# Patient Record
Sex: Female | Born: 1985
Health system: Southern US, Community
[De-identification: ages and names within clinical notes are randomized; demographics above are authoritative.]

## PROBLEM LIST (undated history)

## (undated) DIAGNOSIS — R569 Unspecified convulsions: Secondary | ICD-10-CM

## (undated) DIAGNOSIS — F1010 Alcohol abuse, uncomplicated: Secondary | ICD-10-CM

---

## 2015-09-24 ENCOUNTER — Ambulatory Visit (INDEPENDENT_AMBULATORY_CARE_PROVIDER_SITE_OTHER): Payer: Self-pay | Admitting: Physician Assistant

## 2015-09-24 ENCOUNTER — Ambulatory Visit (INDEPENDENT_AMBULATORY_CARE_PROVIDER_SITE_OTHER): Payer: Self-pay

## 2015-09-24 VITALS — BP 122/80 | HR 99 | Temp 98.9°F | Resp 17 | Ht 66.0 in | Wt 130.0 lb

## 2015-09-24 DIAGNOSIS — M7022 Olecranon bursitis, left elbow: Secondary | ICD-10-CM

## 2015-09-24 DIAGNOSIS — L03114 Cellulitis of left upper limb: Secondary | ICD-10-CM

## 2015-09-24 LAB — POCT CBC
GRANULOCYTE PERCENT: 79.6 % (ref 37–80)
HEMATOCRIT: 43.1 % (ref 37.7–47.9)
Hemoglobin: 14.8 g/dL (ref 12.2–16.2)
Lymph, poc: 1.6 (ref 0.6–3.4)
MCH: 32.1 pg — AB (ref 27–31.2)
MCHC: 34.3 g/dL (ref 31.8–35.4)
MCV: 93.5 fL (ref 80–97)
MID (CBC): 0.6 (ref 0–0.9)
MPV: 7.1 fL (ref 0–99.8)
POC GRANULOCYTE: 8.8 — AB (ref 2–6.9)
POC LYMPH %: 14.5 % (ref 10–50)
POC MID %: 5.9 %M (ref 0–12)
Platelet Count, POC: 254 10*3/uL (ref 142–424)
RBC: 4.61 M/uL (ref 4.04–5.48)
RDW, POC: 12.7 %
WBC: 11 10*3/uL — AB (ref 4.6–10.2)

## 2015-09-24 MED ORDER — CEPHALEXIN 500 MG PO CAPS: 500.0000 mg | ORAL_CAPSULE | Freq: Four times a day (QID) | ORAL | Status: DC

## 2015-09-24 NOTE — Patient Instructions (Signed)
     IF you received an x-ray today, you will receive an invoice from [facility]. Please contact [facility] at [phone] with questions or concerns regarding your invoice.   IF you received labwork today, you will receive an invoice from [facility]. Please contact [facility] at [phone] with questions or concerns regarding your invoice.   Our billing staff will not be able to assist you with questions regarding bills from these companies.  You will be contacted with the lab results as soon as they are available. The fastest way to get your results is to activate your My Chart account. Instructions are located on the last page of this paperwork. If you have not heard from us regarding the results in 2 weeks, please contact this office.      

## 2015-09-24 NOTE — Progress Notes (Signed)
09/24/2015 1:25 PM   DOB: 1986-01-13 / MRN: [id number]  SUBJECTIVE:  Angela Palmer is a 30 y.o. female presenting for a swollen left elbow bursa.  The area has been mildly swollen for about 1 week, however it has become exquisitely tender and red in the last 24 hours.  She denies chills, nausea, emesis, fever at this time.  She has not tried any medication at this time.   She is eating well.   She has No Known Allergies.   She  has no past medical history on file.    She  reports that she has never smoked. She does not have any smokeless tobacco history on file. She  has no sexual activity history on file. The patient  has no past surgical history on file.  Her family history is not on file.  Review of Systems  Constitutional: Negative for fever and chills.  Respiratory: Negative for cough.   Cardiovascular: Negative for chest pain.  Gastrointestinal: Negative for abdominal pain.  Musculoskeletal: Positive for joint pain. Negative for myalgias and falls.  Skin: Negative for itching and rash.  Neurological: Negative for dizziness and headaches.    Problem list and medications reviewed and updated by myself where necessary, and exist elsewhere in the encounter.   OBJECTIVE:  BP 122/80 mmHg  Pulse 99  Temp(Src) 98.9 F (37.2 C) (Oral)  Resp 17  Ht  (1.676 m)  Wt 130 lb (58.968 kg)  BMI 20.99 kg/m2  SpO2 99%  LMP 09/10/2015  Physical Exam  Constitutional: She is oriented to person, place, and time. She appears well-nourished. No distress.  Eyes: EOM are normal. Pupils are equal, round, and reactive to light.  Cardiovascular: Normal rate.   Pulmonary/Chest: Effort normal and breath sounds normal.  Abdominal: She exhibits no distension.  Musculoskeletal:       Arms: Neurological: She is alert and oriented to person, place, and time. No cranial nerve deficit. Gait normal.  Skin: Skin is dry. She is not diaphoretic.  Psychiatric: She has a normal mood and affect.    Vitals reviewed.   Results for orders placed or performed in visit on 09/24/15 (from the past 72 hour(s))  POCT CBC     Status: Abnormal   Collection Time: 09/24/15  1:05 PM  Result Value Ref Range   WBC 11.0 (A) 4.6 - 10.2 K/uL   Lymph, poc 1.6 0.6 - 3.4   POC LYMPH PERCENT 14.5 10 - 50 %L   MID (cbc) 0.6 0 - 0.9   POC MID % 5.9 0 - 12 %M   POC Granulocyte 8.8 (A) 2 - 6.9   Granulocyte percent 79.6 37 - 80 %G   RBC 4.61 4.04 - 5.48 M/uL   Hemoglobin 14.8 12.2 - 16.2 g/dL   HCT, POC 96.2 95.2 - 47.9 %   MCV 93.5 80 - 97 fL   MCH, POC 32.1 (A) 27 - 31.2 pg   MCHC 34.3 31.8 - 35.4 g/dL   RDW, POC 84.1 %   Platelet Count, POC 254 142 - 424 K/uL   MPV 7.1 0 - 99.8 fL    Dg Elbow Complete Left  09/24/2015  CLINICAL DATA:  Swelling, erythema, bursitis EXAM: LEFT ELBOW - COMPLETE 3+ VIEW COMPARISON:  None. FINDINGS: Four views of the left elbow submitted. No acute fracture or subluxation. No periosteal reaction or bony erosion. There is significant soft tissue swelling dorsal elbow region highly suspicious for olecranon bursitis or cellulitis. No joint  effusion. IMPRESSION: No acute fracture or subluxation. Soft tissue swelling dorsal elbow region highly suspicious for olecranon bursitis or cellulitis. Electronically Signed   By: Natasha MeadLiviu  Pop M.D.   On: 09/24/2015 13:09    ASSESSMENT AND PLAN  Angela Semenshton was seen today for elbow injury.  Diagnoses and all orders for this visit:  Bursitis of elbow, left:  -     DG Elbow Complete Left; Future -     POCT CBC  Cellulitis of left upper extremity: Will start keflex today.  If no improvement in the next 24 hours she will return to the office.  If improving will see her back in 48 hours.  Arm padded with casting cotton and an ace.    The patient was advised to call or return to clinic if she does not see an improvement in symptoms or to seek the care of the closest emergency department if she worsens with the above plan.   Deliah BostonMichael Jaki Hammerschmidt, MHS,  PA-C [facility] 09/24/2015 1:25 PM

## 2016-05-07 ENCOUNTER — Observation Stay (HOSPITAL_COMMUNITY)
Admission: EM | Admit: 2016-05-07 | Discharge: 2016-05-09 | Disposition: A | Discharge status: Home / Self Care | Payer: 59 | Attending: Internal Medicine | Admitting: Internal Medicine

## 2016-05-07 ENCOUNTER — Encounter (HOSPITAL_COMMUNITY): Payer: Self-pay

## 2016-05-07 ENCOUNTER — Inpatient Hospital Stay (HOSPITAL_COMMUNITY): Payer: 59

## 2016-05-07 ENCOUNTER — Emergency Department (HOSPITAL_COMMUNITY): Payer: 59

## 2016-05-07 DIAGNOSIS — F10239 Alcohol dependence with withdrawal, unspecified: Principal | ICD-10-CM | POA: Insufficient documentation

## 2016-05-07 DIAGNOSIS — R7401 Elevation of levels of liver transaminase levels: Secondary | ICD-10-CM | POA: Diagnosis present

## 2016-05-07 DIAGNOSIS — F1010 Alcohol abuse, uncomplicated: Secondary | ICD-10-CM

## 2016-05-07 DIAGNOSIS — R569 Unspecified convulsions: Secondary | ICD-10-CM | POA: Diagnosis not present

## 2016-05-07 DIAGNOSIS — E876 Hypokalemia: Secondary | ICD-10-CM | POA: Diagnosis not present

## 2016-05-07 DIAGNOSIS — R740 Nonspecific elevation of levels of transaminase and lactic acid dehydrogenase [LDH]: Secondary | ICD-10-CM | POA: Insufficient documentation

## 2016-05-07 DIAGNOSIS — E872 Acidosis, unspecified: Secondary | ICD-10-CM | POA: Diagnosis present

## 2016-05-07 LAB — COMPREHENSIVE METABOLIC PANEL
ALK PHOS: 59 U/L (ref 38–126)
ALT: 98 U/L — ABNORMAL HIGH (ref 14–54)
AST: 153 U/L — AB (ref 15–41)
Albumin: 5.6 g/dL — ABNORMAL HIGH (ref 3.5–5.0)
Anion gap: 19 — ABNORMAL HIGH (ref 5–15)
BILIRUBIN TOTAL: 0.9 mg/dL (ref 0.3–1.2)
BUN: 6 mg/dL (ref 6–20)
CALCIUM: 9.7 mg/dL (ref 8.9–10.3)
CO2: 18 mmol/L — ABNORMAL LOW (ref 22–32)
CREATININE: 0.57 mg/dL (ref 0.44–1.00)
Chloride: 98 mmol/L — ABNORMAL LOW (ref 101–111)
GFR calc Af Amer: >60 mL/min (ref >60)
GLUCOSE: 152 mg/dL — AB (ref 65–99)
POTASSIUM: 3.4 mmol/L — AB (ref 3.5–5.1)
Sodium: 135 mmol/L (ref 135–145)
TOTAL PROTEIN: 8.3 g/dL — AB (ref 6.5–8.1)

## 2016-05-07 LAB — CBC WITH DIFFERENTIAL/PLATELET
BASOS ABS: 0.1 10*3/uL (ref 0.0–0.1)
Basophils Relative: 1 %
Eosinophils Absolute: 0 10*3/uL (ref 0.0–0.7)
Eosinophils Relative: 0 %
HEMATOCRIT: 40.6 % (ref 36.0–46.0)
HEMOGLOBIN: 13.9 g/dL (ref 12.0–15.0)
LYMPHS PCT: 28 %
Lymphs Abs: 1.4 10*3/uL (ref 0.7–4.0)
MCH: 32.6 pg (ref 26.0–34.0)
MCHC: 34.2 g/dL (ref 30.0–36.0)
MCV: 95.3 fL (ref 78.0–100.0)
MONO ABS: 0.6 10*3/uL (ref 0.1–1.0)
Monocytes Relative: 12 %
NEUTROS ABS: 2.9 10*3/uL (ref 1.7–7.7)
NEUTROS PCT: 59 %
Platelets: 169 10*3/uL (ref 150–400)
RBC: 4.26 MIL/uL (ref 3.87–5.11)
RDW: 13.1 % (ref 11.5–15.5)
WBC: 5 10*3/uL (ref 4.0–10.5)

## 2016-05-07 LAB — RAPID URINE DRUG SCREEN, HOSP PERFORMED
Amphetamines: NOT DETECTED
Barbiturates: NOT DETECTED
Benzodiazepines: NOT DETECTED
COCAINE: NOT DETECTED
OPIATES: NOT DETECTED
TETRAHYDROCANNABINOL: NOT DETECTED

## 2016-05-07 LAB — GAMMA GT: GGT: 128 U/L — AB (ref 7–50)

## 2016-05-07 LAB — URINALYSIS, ROUTINE W REFLEX MICROSCOPIC
BACTERIA UA: NONE SEEN
BILIRUBIN URINE: NEGATIVE
Glucose, UA: NEGATIVE mg/dL
KETONES UR: 20 mg/dL — AB
LEUKOCYTES UA: NEGATIVE
Nitrite: NEGATIVE
PROTEIN: 30 mg/dL — AB
Specific Gravity, Urine: 1.011 (ref 1.005–1.030)
pH: 6 (ref 5.0–8.0)

## 2016-05-07 LAB — I-STAT BETA HCG BLOOD, ED (MC, WL, AP ONLY): I-stat hCG, quantitative: <5 m[IU]/mL (ref <5)

## 2016-05-07 LAB — CK: Total CK: 182 U/L (ref 38–234)

## 2016-05-07 LAB — ETHANOL

## 2016-05-07 LAB — MAGNESIUM: MAGNESIUM: 1.8 mg/dL (ref 1.7–2.4)

## 2016-05-07 MED ORDER — LORAZEPAM 1 MG PO TABS: 1.0000 mg | ORAL_TABLET | Freq: Four times a day (QID) | ORAL | Status: DC | PRN

## 2016-05-07 MED ORDER — FOLIC ACID 5 MG/ML IJ SOLN
1.0000 mg | Freq: Every day | INTRAMUSCULAR | Status: DC
  Administered 2016-05-07: 1 mg via INTRAVENOUS
  Filled 2016-05-07 (×2): qty 0.2

## 2016-05-07 MED ORDER — SODIUM CHLORIDE 0.9 % IV BOLUS (SEPSIS)
1000.0000 mL | Freq: Once | INTRAVENOUS | Status: AC
  Administered 2016-05-07: 1000 mL via INTRAVENOUS

## 2016-05-07 MED ORDER — ADULT MULTIVITAMIN W/MINERALS CH
1.0000 | ORAL_TABLET | Freq: Every day | ORAL | Status: DC
  Administered 2016-05-08 – 2016-05-09 (×3): 1 via ORAL
  Filled 2016-05-07 (×3): qty 1

## 2016-05-07 MED ORDER — FOLIC ACID 1 MG PO TABS
1.0000 mg | ORAL_TABLET | Freq: Every day | ORAL | Status: DC
  Administered 2016-05-08 – 2016-05-09 (×2): 1 mg via ORAL
  Filled 2016-05-07 (×2): qty 1

## 2016-05-07 MED ORDER — THIAMINE HCL 100 MG/ML IJ SOLN
100.0000 mg | Freq: Once | INTRAMUSCULAR | Status: AC
  Administered 2016-05-07: 100 mg via INTRAVENOUS
  Filled 2016-05-07: qty 2

## 2016-05-07 MED ORDER — SODIUM CHLORIDE 0.9 % IV SOLN
75.0000 mL/h | INTRAVENOUS | Status: DC
  Administered 2016-05-07 – 2016-05-08 (×2): 75 mL/h via INTRAVENOUS

## 2016-05-07 MED ORDER — ENOXAPARIN SODIUM 40 MG/0.4ML ~~LOC~~ SOLN
40.0000 mg | Freq: Every day | SUBCUTANEOUS | Status: DC
  Administered 2016-05-08: 40 mg via SUBCUTANEOUS
  Filled 2016-05-07 (×2): qty 0.4

## 2016-05-07 MED ORDER — VITAMIN B-1 100 MG PO TABS
100.0000 mg | ORAL_TABLET | Freq: Every day | ORAL | Status: DC
  Administered 2016-05-08 – 2016-05-09 (×2): 100 mg via ORAL
  Filled 2016-05-07 (×2): qty 1

## 2016-05-07 MED ORDER — ONDANSETRON HCL 4 MG PO TABS: 4.0000 mg | ORAL_TABLET | Freq: Four times a day (QID) | ORAL | Status: DC | PRN

## 2016-05-07 MED ORDER — THIAMINE HCL 100 MG/ML IJ SOLN: 100.0000 mg | Freq: Every day | INTRAMUSCULAR | Status: DC

## 2016-05-07 MED ORDER — LORAZEPAM 2 MG/ML IJ SOLN
1.0000 mg | Freq: Once | INTRAMUSCULAR | Status: AC
  Administered 2016-05-07: 1 mg via INTRAVENOUS
  Filled 2016-05-07: qty 1

## 2016-05-07 MED ORDER — POTASSIUM CHLORIDE CRYS ER 20 MEQ PO TBCR
30.0000 meq | EXTENDED_RELEASE_TABLET | ORAL | Status: AC
  Administered 2016-05-07: 30 meq via ORAL
  Filled 2016-05-07: qty 1

## 2016-05-07 MED ORDER — LORAZEPAM 2 MG/ML IJ SOLN: 1.0000 mg | Freq: Four times a day (QID) | INTRAMUSCULAR | Status: DC | PRN

## 2016-05-07 MED ORDER — LORAZEPAM 2 MG/ML IJ SOLN: 1.0000 mg | INTRAMUSCULAR | Status: DC | PRN

## 2016-05-07 MED ORDER — ONDANSETRON HCL 4 MG/2ML IJ SOLN: 4.0000 mg | Freq: Four times a day (QID) | INTRAMUSCULAR | Status: DC | PRN

## 2016-05-07 NOTE — ED Triage Notes (Signed)
Pt presents after seizure-like activity.  Denies pain.  Pt's boyfriend reports Pt "stiffened out for around 2-3 minutes."  Sts similar episode around 1 month ago.  Denies aura and seizure history.

## 2016-05-07 NOTE — ED Notes (Signed)
Attempted to give report, RN unavailable, will return call

## 2016-05-07 NOTE — ED Notes (Signed)
Pt appears calm and without distress, SZ pad in place. Pt denies pain at this tim.

## 2016-05-07 NOTE — ED Notes (Signed)
[facility],

## 2016-05-07 NOTE — H&P (Addendum)
History and Physical    Angela Lindenshton Dawn Narciso ZOX:[id number] DOB: 1985-08-29 DOA: 05/07/2016  Referring MD/NP/PA: Dr. Alyce PaganIsaccs PCP: No primary care provider on file.  Patient coming from: Home  Chief Complaint:  Seizure  HPI: Angela Palmer is a 31 y.o. female with medical history significant of alcohol abuse; who presents after having what is thought to be a seizure. History obtained from the patient's boyfriend has patient was unaware of episode. Patient had just sat down in the passenger seat of the car with her boyfriend this afternoon when the event happen. He noted stiffening of her upper and lower extremities, was nonresponsive, and then seemed as though she was stretching out her arms like she were yawning. Associated symptoms included drooling, gargling, tongue biting, and intermittent jerking like movements of the upper and lower extremities. He estimates that symptoms lasted 2-3 minutes. Thereafter, the next 10-20 minutes the patient was confused with a glossed over stare not following commands. She did not lose bowel or bladder. Similar episode occurred approximately one month ago while on the couch where she bite her tongue was bleeding.  On average patient drinks on a daily basis. On an average day of drinking the patient gives the example of consuming 3 or more shots and a High-life 12 ounce beer. Her last drink 12 ounce beer earlier this morning. Denies any alcohol, illicit drug use, chest pain, nausea, vomiting, diarrhea, focal weakness, change of vision, or rash.    ED Course: Upon admission into the emergency department patient was noted be afebrile, pulse 118, respirations 23, and O2 saturations/ blood pressure maintained. Initial CT scan of the brain show no acute abnormalities. Lab work revealed normal CBC, potassium 3.4, chloride 98, CO2 18, BUN 6, creatinine 0.57, calcium 9.7, glucose 152, anion gap 19, albumin 5.6, total protein 8.3, AST 153, ALT 98, and all other labs were  relatively within normal limits. Urinalysis was negative for signs of infection, but positive for ketones and Hbg. Patient was given folic acid 1 g, thiamine 100 mg, 2 mg of Ativan, and 1 L normal saline IV fluids in the ED. Neurology was consulted and recommended [facility] bed for further testing including MRI and EEG.  Review of Systems: As per HPI otherwise 10 point review of systems negative.   History reviewed. No pertinent past medical history.  History reviewed. No pertinent surgical history.   reports that she has never smoked. She has never used smokeless tobacco. She reports that she drinks alcohol. She reports that she does not use drugs.  No Known Allergies  History reviewed. No pertinent family history.  Prior to Admission medications   Medication Sig Start Date End Date Taking? Authorizing Provider  ibuprofen (ADVIL,MOTRIN) 200 MG tablet Take 200 mg by mouth every 6 (six) hours as needed for moderate pain.   Yes Historical Provider, MD  cephALEXin (KEFLEX) 500 MG capsule Take 1 capsule (500 mg total) by mouth 4 (four) times daily. Patient not taking: Reported on 05/07/2016 09/24/15   Ofilia NeasMichael L Clark, PA-C    Physical Exam:   Constitutional: Young female in NAD, calm, comfortable Vitals:   05/07/16 1736 05/07/16 1907  BP: 143/96 140/66  Pulse: 118 118  Resp: 22 23  Temp: 98.3 F (36.8 C)   TempSrc: Oral   SpO2: 93% 100%   Eyes: PERRL, lids and conjunctivae normal ENMT: Mucous membranes are moist. Posterior pharynx clear of any exudate or lesions.Normal dentition.  Neck: normal, supple, no masses, no  thyromegaly Respiratory: Mildly tachypneic, clear to auscultation bilaterally, no wheezing, no crackles. No accessory muscle use.  Cardiovascular: Tachycardic, no murmurs / rubs / gallops. No extremity edema. 2+ pedal pulses. No carotid bruits.  Abdomen: no tenderness, no masses palpated. No hepatosplenomegaly. Bowel sounds positive.    Musculoskeletal: no clubbing / cyanosis. No joint deformity upper and lower extremities. Good ROM, no contractures. Normal muscle tone.  Skin: no rashes, lesions, ulcers. No induration Neurologic: CN 2-12 grossly intact. Sensation intact, DTR normal. Strength 5/5 in all 4.  Psychiatric: Normal judgment and insight. Alert and oriented x 3. Normal mood.     Labs on Admission: I have personally reviewed following labs and imaging studies  CBC:  Recent Labs Lab 05/07/16 1741  WBC 5.0  NEUTROABS 2.9  HGB 13.9  HCT 40.6  MCV 95.3  PLT 169   Basic Metabolic Panel:  Recent Labs Lab 05/07/16 1741  NA 135  K 3.4*  CL 98*  CO2 18*  GLUCOSE 152*  BUN 6  CREATININE 0.57  CALCIUM 9.7  MG 1.8   GFR: CrCl cannot be calculated (Unknown ideal weight.). Liver Function Tests:  Recent Labs Lab 05/07/16 1741  AST 153*  ALT 98*  ALKPHOS 59  BILITOT 0.9  PROT 8.3*  ALBUMIN 5.6*   No results for input(s): LIPASE, AMYLASE in the last 168 hours. No results for input(s): AMMONIA in the last 168 hours. Coagulation Profile: No results for input(s): INR, PROTIME in the last 168 hours. Cardiac Enzymes: No results for input(s): CKTOTAL, CKMB, CKMBINDEX, TROPONINI in the last 168 hours. BNP (last 3 results) No results for input(s): PROBNP in the last 8760 hours. HbA1C: No results for input(s): HGBA1C in the last 72 hours. CBG: No results for input(s): GLUCAP in the last 168 hours. Lipid Profile: No results for input(s): CHOL, HDL, LDLCALC, TRIG, CHOLHDL, LDLDIRECT in the last 72 hours. Thyroid Function Tests: No results for input(s): TSH, T4TOTAL, FREET4, T3FREE, THYROIDAB in the last 72 hours. Anemia Panel: No results for input(s): VITAMINB12, FOLATE, FERRITIN, TIBC, IRON, RETICCTPCT in the last 72 hours. Urine analysis: No results found for: COLORURINE, APPEARANCEUR, LABSPEC, PHURINE, GLUCOSEU, HGBUR, BILIRUBINUR, KETONESUR, PROTEINUR, UROBILINOGEN, NITRITE,  LEUKOCYTESUR Sepsis Labs: No results found for this or any previous visit (from the past 240 hour(s)).   Radiological Exams on Admission: Ct Head Wo Contrast  Result Date: 05/07/2016 CLINICAL DATA:  Seizure. EXAM: CT HEAD WITHOUT CONTRAST TECHNIQUE: Contiguous axial images were obtained from the base of the skull through the vertex without intravenous contrast. COMPARISON:  None. FINDINGS: Brain: No evidence of acute infarction, hemorrhage, hydrocephalus, extra-axial collection or mass lesion/mass effect. Vascular: No hyperdense vessel or unexpected calcification. Skull: Normal. Negative for fracture or focal lesion. Sinuses/Orbits: No acute finding. Other: None. IMPRESSION: No acute abnormalities. Electronically Signed   By: Gerome Sam III M.D   On: 05/07/2016 18:46    EKG: ordered. Addendum impression sinus tachycardia.  Assessment/Plan Seizure: Acute. Suspect secondary to significant alcohol consumption on admission patient's alcohol level was undetectable. Nephrology is seeing a pulmonologist the [facility]. - Admit to telemetry bed at [facility] - Seizure protocol initiated - Ativan IV prn seizure  - Normal saline at 41ml/hr - Check MRI and EEG - Asked ED physician to add on UDS, CPK, GGT  - Appreciate neuro consultative services  Alcohol abuse: Patient is a daily drinker. - [facility] protocols - social work for substance abuse  Metabolic acidosis with elevated anion gap: Also thought to be secondary to  alcohol abuse. - Follow-up repeat lab work  Hypokalemia: Acute. Initial potassium 3.4.  - Give 1 dose of potassium chloride 30 mEq  po  Transamintis : Elevated AST ALT ratio that appears 1.5 to give suspicion for alcohol abuse. -  follow-up GGT   DVT prophylaxis: Lovenox   Code Status: Full Family Communication: Discussed overall plan of care with patient and boyfriend present at bedside Disposition Plan: likely  discharge home and 1-2 days  Consults called:  Neurology Admission status:observation  Clydie Braun MD [facility] Pager [phone]  If 7PM-7AM, please contact night-coverage [email or web address] Password [facility]  05/07/2016, 7:32 PM

## 2016-05-07 NOTE — ED Provider Notes (Signed)
[facility]-EMERGENCY DEPT Provider Note   CSN: [id number] Arrival date & time: 05/07/16  1724     History   Chief Complaint Chief Complaint  Patient presents with  . Seizures    HPI Angela Palmer is a 31 y.o. female.  HPI  31 yo F with no significant PMHx here with new onset seizure. Pt was reportedly in the car with her boyfriend when she acutely "locked up," looked to the side, and began to have generalized tonic activity x 3-4 minutes. She was "out of it" afterwards and did not know her name. Over the last 20 min, she has been "coming around" and is back to baseline. Currently, she denies any pain. No HA, vision changes, weakness, or numbness. No new meds or supplements. No recent trauma. She does report a h/o regular daily EtOH use, but has not decreased this. Of note, approx 1 month ago she was found confused after vomiting, and had bit her tongue. Unsure if she had a seizure.  History reviewed. No pertinent past medical history.  Patient Active Problem List   Diagnosis Date Noted  . Seizure (HCC) 05/07/2016  . Alcohol abuse 05/07/2016  . Hypokalemia 05/07/2016  . Transaminitis 05/07/2016  . Metabolic acidosis 05/07/2016    History reviewed. No pertinent surgical history.  OB History    No data available       Home Medications    Prior to Admission medications   Medication Sig Start Date End Date Taking? Authorizing Provider  ibuprofen (ADVIL,MOTRIN) 200 MG tablet Take 200 mg by mouth every 6 (six) hours as needed for moderate pain.   Yes Historical Provider, MD  cephALEXin (KEFLEX) 500 MG capsule Take 1 capsule (500 mg total) by mouth 4 (four) times daily. Patient not taking: Reported on 05/07/2016 09/24/15   Ofilia NeasMichael L Clark, PA-C    Family History History reviewed. No pertinent family history.  Social History Social History  Substance Use Topics  . Smoking status: Never Smoker  . Smokeless tobacco: Never Used  . Alcohol use 0.0 oz/week     Allergies     Patient has no known allergies.   Review of Systems Review of Systems  Constitutional: Positive for fatigue. Negative for chills and fever.  HENT: Negative for congestion and rhinorrhea.   Eyes: Negative for visual disturbance.  Respiratory: Negative for cough, shortness of breath and wheezing.   Cardiovascular: Negative for chest pain and leg swelling.  Gastrointestinal: Negative for abdominal pain, diarrhea, nausea and vomiting.  Genitourinary: Negative for dysuria and flank pain.  Musculoskeletal: Negative for neck pain and neck stiffness.  Skin: Negative for rash and wound.  Allergic/Immunologic: Negative for immunocompromised state.  Neurological: Positive for seizures. Negative for syncope, weakness and headaches.  All other systems reviewed and are negative.    Physical Exam Updated Vital Signs BP (!) 139/96 (BP Location: Right Arm) Comment: nurse notified  Pulse 71   Temp 98.3 F (36.8 C) (Oral)   Resp 16   Ht 5\' 5"  (1.651 m)   Wt 130 lb (59 kg)   LMP 04/06/2016   SpO2 100%   BMI 21.63 kg/m   Physical Exam  Constitutional: She is oriented to person, place, and time. She appears well-developed and well-nourished. No distress.  HENT:  Head: Normocephalic and atraumatic.  Eyes: Conjunctivae are normal.  Neck: Neck supple.  Cardiovascular: Regular rhythm and normal heart sounds.  Tachycardia present.  Exam reveals no friction rub.   No murmur heard. Pulmonary/Chest: Effort normal and  breath sounds normal. No respiratory distress. She has no wheezes. She has no rales.  Abdominal: She exhibits no distension.  Musculoskeletal: She exhibits no edema.  Neurological: She is alert and oriented to person, place, and time. She exhibits normal muscle tone.  Skin: Skin is warm. Capillary refill takes less than 2 seconds.  Psychiatric: She has a normal mood and affect.  Nursing note and vitals reviewed.   Neurological Exam:  Mental Status: Alert and oriented to person,  place, and time. Attention and concentration normal. Speech clear. Recent memory is intact. Cranial Nerves: Visual fields grossly intact. EOMI and PERRLA. No nystagmus noted. Facial sensation intact at forehead, maxillary cheek, and chin/mandible bilaterally. No facial asymmetry or weakness. Hearing grossly normal. Uvula is midline, and palate elevates symmetrically. Normal SCM and trapezius strength. Tongue midline without fasciculations. Motor: Muscle strength 5/5 in proximal and distal UE and LE bilaterally. No pronator drift. Muscle tone normal. Reflexes: 2+ and symmetrical in all four extremities.  Sensation: Intact to light touch in upper and lower extremities distally bilaterally.  Gait: Normal without ataxia. Coordination: Normal FTN bilaterally.   ED Treatments / Results  Labs (all labs ordered are listed, but only abnormal results are displayed) Labs Reviewed  COMPREHENSIVE METABOLIC PANEL - Abnormal; Notable for the following:       Result Value   Potassium 3.4 (*)    Chloride 98 (*)    CO2 18 (*)    Glucose, Bld 152 (*)    Total Protein 8.3 (*)    Albumin 5.6 (*)    AST 153 (*)    ALT 98 (*)    Anion gap 19 (*)    All other components within normal limits  URINALYSIS, ROUTINE W REFLEX MICROSCOPIC - Abnormal; Notable for the following:    Color, Urine STRAW (*)    Hgb urine dipstick MODERATE (*)    Ketones, ur 20 (*)    Protein, ur 30 (*)    Squamous Epithelial / LPF 0-5 (*)    All other components within normal limits  GAMMA GT - Abnormal; Notable for the following:    GGT 128 (*)    All other components within normal limits  CBC WITH DIFFERENTIAL/PLATELET  MAGNESIUM  ETHANOL  RAPID URINE DRUG SCREEN, HOSP PERFORMED  CK  CBC  COMPREHENSIVE METABOLIC PANEL  I-STAT BETA HCG BLOOD, ED ([facility], [facility], [facility] ONLY)    EKG  EKG Interpretation None       Radiology Ct Head Wo Contrast  Result Date: 05/07/2016 CLINICAL DATA:  Seizure. EXAM: CT HEAD WITHOUT CONTRAST  TECHNIQUE: Contiguous axial images were obtained from the base of the skull through the vertex without intravenous contrast. COMPARISON:  None. FINDINGS: Brain: No evidence of acute infarction, hemorrhage, hydrocephalus, extra-axial collection or mass lesion/mass effect. Vascular: No hyperdense vessel or unexpected calcification. Skull: Normal. Negative for fracture or focal lesion. Sinuses/Orbits: No acute finding. Other: None. IMPRESSION: No acute abnormalities. Electronically Signed   By: Gerome Sam III M.D   On: 05/07/2016 18:46   Mr Brain Wo Contrast  Result Date: 05/07/2016 CLINICAL DATA:  31 y/o  F; seizure. EXAM: MRI HEAD WITHOUT CONTRAST TECHNIQUE: Multiplanar, multiecho pulse sequences of the brain and surrounding structures were obtained without intravenous contrast. COMPARISON:  05/07/2016 CT of the head. FINDINGS: Brain: No acute infarction, hemorrhage, hydrocephalus, extra-axial collection or mass lesion. Midline structures are intact including a morphologically normal pituitary as well as a complete corpus callosum and vermis. No Chiari malformation. No evidence  of cortical dysplasia, heterotopia, or disorder of cortical formation is identified. Hippocampi by are symmetric in size and signal bilaterally. Vascular: Normal flow voids. Skull and upper cervical spine: Normal marrow signal. Sinuses/Orbits: Negative. Other: None. IMPRESSION: 1. No structural cause of seizures identified. 2. Unremarkable MRI of the brain for age. Electronically Signed   By: Mitzi Hansen M.D.   On: 05/07/2016 23:06    Procedures Procedures (including critical care time)  Medications Ordered in ED Medications  folic acid injection 1 mg (1 mg Intravenous Given 05/07/16 2147)  LORazepam (ATIVAN) tablet 1 mg (not administered)    Or  LORazepam (ATIVAN) injection 1 mg (not administered)  thiamine (VITAMIN B-1) tablet 100 mg (not administered)    Or  thiamine (B-1) injection 100 mg (not  administered)  folic acid (FOLVITE) tablet 1 mg (not administered)  0.9 %  sodium chloride infusion (75 mL/hr Intravenous New Bag/Given 05/07/16 2143)  multivitamin with minerals tablet 1 tablet (1 tablet Oral Given 05/08/16 0105)  enoxaparin (LOVENOX) injection 40 mg (not administered)  LORazepam (ATIVAN) injection 1-2 mg (not administered)  ondansetron (ZOFRAN) tablet 4 mg (not administered)    Or  ondansetron (ZOFRAN) injection 4 mg (not administered)  LORazepam (ATIVAN) injection 1 mg (1 mg Intravenous Given 05/07/16 1756)  sodium chloride 0.9 % bolus 1,000 mL (0 mLs Intravenous Stopped 05/07/16 1843)  LORazepam (ATIVAN) injection 1 mg (1 mg Intravenous Given 05/07/16 1907)  thiamine (B-1) injection 100 mg (100 mg Intravenous Given 05/07/16 2146)  potassium chloride (K-DUR,KLOR-CON) CR tablet 30 mEq (30 mEq Oral Given 05/07/16 2145)     Initial Impression / Assessment and Plan / ED Course  I have reviewed the triage vital signs and the nursing notes.  Pertinent labs & imaging results that were available during my care of the patient were reviewed by me and considered in my medical decision making (see chart for details).     31 yo F with no significant PMHx here with second seizure in past month. Labs show dehydration, c/w seizure, also AST:ALT >2:1 c/w possible alcoholic hepatitis. Pt does admt to EtOH use - raising possibility of EtOH w/d seizure versus primary epilepsy. CT head neg. D/w Neuro - he recommends admit for MRI, EEG. IVF, Ativan given. Thiamine/folate given.  Final Clinical Impressions(s) / ED Diagnoses   Final diagnoses:  Seizure [facility])    New Prescriptions Current Discharge Medication List       Shaune Pollack, MD 05/08/16 [phone]

## 2016-05-08 ENCOUNTER — Inpatient Hospital Stay (HOSPITAL_COMMUNITY): Payer: 59

## 2016-05-08 DIAGNOSIS — R569 Unspecified convulsions: Secondary | ICD-10-CM | POA: Diagnosis not present

## 2016-05-08 DIAGNOSIS — F1010 Alcohol abuse, uncomplicated: Secondary | ICD-10-CM | POA: Diagnosis not present

## 2016-05-08 DIAGNOSIS — E872 Acidosis: Secondary | ICD-10-CM | POA: Diagnosis not present

## 2016-05-08 LAB — TSH: TSH: 1.456 u[IU]/mL (ref 0.350–4.500)

## 2016-05-08 NOTE — Progress Notes (Signed)
Pt arrived on unit and transferred to bed.  Boyfriend at bedside.  She is alert and oriented.  Able to make needs known and ambulate to bathroom with no difficulties. Placed on seizure precautions, telemetry, and paged neurologist of pt arrival on unit.  Awaiting response.  Will continue to monitor.

## 2016-05-08 NOTE — Progress Notes (Signed)
Pt. BP 143/108, HR 105, re checked 139/107, HR 95. Text paged Dr. York SpanielBuriev, will await for return call and continue to monitor.  Forbes Cellarelcine Reianna Batdorf, RN

## 2016-05-08 NOTE — Procedures (Signed)
ELECTROENCEPHALOGRAM REPORT  Date of Study: 05/08/2016  Patient's Name: Angela Palmer MRN: [id number] Date of Birth: 1986-02-29  Referring Provider: Dr. Madelyn Flavorsondell Smith  Clinical History: This is a 31 year old woman with seizure.  Medications: folic acid (FOLVITE) tablet 1 mg  multivitamin with minerals tablet 1 tablet  thiamine (B-1) injection 100 mg   Technical Summary: A multichannel digital EEG recording measured by the international 10-20 system with electrodes applied with paste and impedances below 5000 ohms performed in our laboratory with EKG monitoring in an awake and drowsy patient.  Hyperventilation and photic stimulation were performed.  The digital EEG was referentially recorded, reformatted, and digitally filtered in a variety of bipolar and referential montages for optimal display.    Description: The patient is awake and drowsy during the recording.  During maximal wakefulness, there is a symmetric, medium voltage 9 Hz posterior dominant rhythm that attenuates with eye opening.  The record is symmetric.  During drowsiness, there is an increase in theta slowing of the background.  Deeper stages of sleep were not seen.  Hyperventilation and photic stimulation did not elicit any abnormalities.  There were no epileptiform discharges or electrographic seizures seen.    EKG lead was unremarkable.  Impression: This awake and drowsy EEG is normal.    Clinical Correlation: A normal EEG does not exclude a clinical diagnosis of epilepsy. Clinical correlation is advised.   Patrcia DollyKaren Adalaide Jaskolski, M.D.

## 2016-05-08 NOTE — Progress Notes (Signed)
CSW received consult regarding ETOH use. CSW spoke with patient. Patient reported that she drinks "a lot" but doesn't feel like it is a problem for her. She states she drinks in social situations and she just started a new job, which has been stressful. CSW offered resources, but patient denied them.  CSW signing off.  Osborne Cascoadia Taj Arteaga LCSWA [phone]

## 2016-05-08 NOTE — Progress Notes (Signed)
[facility] PROGRESS NOTE  Angela Palmer ZOX:[id number] DOB: 1985-09-24 DOA: 05/07/2016 PCP: No primary care provider on file.  Assessment/Plan: 31 y.o. female with medical history significant of alcohol abuse presented after having what is thought to be a seizure. History obtained from the patient's boyfriend has patient was unaware of episode. Patient had just sat down in the passenger seat of the car with her boyfriend this afternoon when the event happen. He noted stiffening of her upper and lower extremities, was nonresponsive, and then seemed as though she was stretching out her arms like she were yawning  Probable alcohols withdrawal seizure. MRI brain, EEG: unremarkable. No new episodes of seizure inpatient. We will cont monitor on benzo prn for ciwa. Likely will not require antiepileptics at discharge, awaiting neuro input   Alcoholism. Etoh withdrawals. Improving, no hallucinations. Cont monitor with prn ativan on ciwa.  -mild AG acidosis. Received ivf. recheck labs   questionable hyperthyroid symptoms at baseline, check tsh  Code Status: full Family Communication: d/w patient (indicate person spoken with, relationship, and if by phone, the number) Disposition Plan: home 24-48 hrs    Consultants:  Neurology   Procedures:  EEG  Antibiotics:  none (indicate start date, and stop date if known)  HPI/Subjective: Alert, mild anxiety. Alert, oriented, no hallucinations. No new seizures   Objective: Vitals:   05/08/16 1306 05/08/16 1308  BP: (!) 143/108 (!) 139/107  Pulse: (!) 105 95  Resp: 16   Temp: 98.3 F (36.8 C)     Intake/Output Summary (Last 24 hours) at 05/08/16 1337 Last data filed at 05/08/16 1054  Gross per 24 hour  Intake           2032.5 ml  Output             1900 ml  Net            132.5 ml   Filed Weights   05/07/16 2003  Weight: 59 kg (130 lb)    Exam:   General:  No distress   Cardiovascular: s1,s2 mild tachycardia    Respiratory: CTA BL  Abdomen: soft, nt, nd   Musculoskeletal: no leg edema    Data Reviewed: Basic Metabolic Panel:  Recent Labs Lab 05/07/16 1741  NA 135  K 3.4*  CL 98*  CO2 18*  GLUCOSE 152*  BUN 6  CREATININE 0.57  CALCIUM 9.7  MG 1.8   Liver Function Tests:  Recent Labs Lab 05/07/16 1741  AST 153*  ALT 98*  ALKPHOS 59  BILITOT 0.9  PROT 8.3*  ALBUMIN 5.6*   No results for input(s): LIPASE, AMYLASE in the last 168 hours. No results for input(s): AMMONIA in the last 168 hours. CBC:  Recent Labs Lab 05/07/16 1741  WBC 5.0  NEUTROABS 2.9  HGB 13.9  HCT 40.6  MCV 95.3  PLT 169   Cardiac Enzymes:  Recent Labs Lab 05/07/16 1741  CKTOTAL 182   BNP (last 3 results) No results for input(s): BNP in the last 8760 hours.  ProBNP (last 3 results) No results for input(s): PROBNP in the last 8760 hours.  CBG: No results for input(s): GLUCAP in the last 168 hours.  No results found for this or any previous visit (from the past 240 hour(s)).   Studies: Ct Head Wo Contrast  Result Date: 05/07/2016 CLINICAL DATA:  Seizure. EXAM: CT HEAD WITHOUT CONTRAST TECHNIQUE: Contiguous axial images were obtained from the base of the skull through the vertex without intravenous contrast. COMPARISON:  None. FINDINGS: Brain: No evidence of acute infarction, hemorrhage, hydrocephalus, extra-axial collection or mass lesion/mass effect. Vascular: No hyperdense vessel or unexpected calcification. Skull: Normal. Negative for fracture or focal lesion. Sinuses/Orbits: No acute finding. Other: None. IMPRESSION: No acute abnormalities. Electronically Signed   By: Gerome Sam III M.D   On: 05/07/2016 18:46   Mr Brain Wo Contrast  Result Date: 05/07/2016 CLINICAL DATA:  31 y/o  F; seizure. EXAM: MRI HEAD WITHOUT CONTRAST TECHNIQUE: Multiplanar, multiecho pulse sequences of the brain and surrounding structures were obtained without intravenous contrast. COMPARISON:  05/07/2016  CT of the head. FINDINGS: Brain: No acute infarction, hemorrhage, hydrocephalus, extra-axial collection or mass lesion. Midline structures are intact including a morphologically normal pituitary as well as a complete corpus callosum and vermis. No Chiari malformation. No evidence of cortical dysplasia, heterotopia, or disorder of cortical formation is identified. Hippocampi by are symmetric in size and signal bilaterally. Vascular: Normal flow voids. Skull and upper cervical spine: Normal marrow signal. Sinuses/Orbits: Negative. Other: None. IMPRESSION: 1. No structural cause of seizures identified. 2. Unremarkable MRI of the brain for age. Electronically Signed   By: Mitzi Hansen M.D.   On: 05/07/2016 23:06    Scheduled Meds: . enoxaparin (LOVENOX) injection  40 mg Subcutaneous Daily  . folic acid  1 mg Oral Daily  . multivitamin with minerals  1 tablet Oral Daily  . thiamine  100 mg Oral Daily   Or  . thiamine  100 mg Intravenous Daily   Continuous Infusions:  Principal Problem:   Seizure (HCC) Active Problems:   Alcohol abuse   Hypokalemia   Transaminitis   Metabolic acidosis    Time spent: >35 minutes     Esperanza Sheets  [facility] Pager [phone]. If 7PM-7AM, please contact night-coverage at [email or web address], password [facility] 05/08/2016, 1:37 PM  LOS: 1 day

## 2016-05-08 NOTE — Progress Notes (Signed)
Routine EEG completed, results pending. 

## 2016-05-09 DIAGNOSIS — R740 Nonspecific elevation of levels of transaminase and lactic acid dehydrogenase [LDH]: Secondary | ICD-10-CM

## 2016-05-09 DIAGNOSIS — E872 Acidosis: Secondary | ICD-10-CM

## 2016-05-09 DIAGNOSIS — E876 Hypokalemia: Secondary | ICD-10-CM

## 2016-05-09 DIAGNOSIS — R569 Unspecified convulsions: Secondary | ICD-10-CM | POA: Diagnosis not present

## 2016-05-09 LAB — BASIC METABOLIC PANEL
ANION GAP: 12 (ref 5–15)
BUN: 7 mg/dL (ref 6–20)
CO2: 24 mmol/L (ref 22–32)
Calcium: 10.2 mg/dL (ref 8.9–10.3)
Chloride: 100 mmol/L — ABNORMAL LOW (ref 101–111)
Creatinine, Ser: 0.62 mg/dL (ref 0.44–1.00)
GFR calc non Af Amer: >60 mL/min (ref >60)
Glucose, Bld: 95 mg/dL (ref 65–99)
Potassium: 4.1 mmol/L (ref 3.5–5.1)
Sodium: 136 mmol/L (ref 135–145)

## 2016-05-09 NOTE — Discharge Summary (Signed)
Physician Discharge Summary  Laqueta Lindenshton Dawn Klinkner [id number] DOB: [birth year] DOA: 05/07/2016  PCP: No primary care provider on file.  Admit date: 05/07/2016 Discharge date: 05/09/2016  Admitted From: Home Disposition:  Home  Recommendations for Outpatient Follow-up:  1. Follow up with PCP in 2-3 weeks  Discharge Condition:Stable CODE STATUS:Full Diet recommendation: Regular   Brief/Interim Summary: 31 y.o.femalewith medical history significant of alcohol abuse presented after having what is thought to be a seizure. History obtained from the patient's boyfriend has patient was unaware of episode. Patient had just sat down in the passenger seat of the car with her boyfriend this afternoon when the event happen. He noted stiffening of her upper and lower extremities, was nonresponsive, and then seemed as though she was stretching out her arms likeshe were yawning  Likely alcohols withdrawal seizure.  -MRI brain, EEG: unremarkable.  -No new episodes of seizure inpatient.  -Discussed case with on -call Neurologist who reviewed imaging and studies over the phone. Agrees that presenting symptoms are likely ETOH withdrawal seizures -patient advised strongly to avoid further ETOH in the future. Neurology recommends no AED's at this time  Alcoholism.  - Etoh withdrawals.  -Patient was continued on CIWA - no hallucinations.   Discharge Diagnoses:  Principal Problem:   Seizure [facility](HCC) Active Problems:   Alcohol abuse   Hypokalemia   Transaminitis   Metabolic acidosis    Discharge Instructions   Allergies as of 05/09/2016   No Known Allergies     Medication List    STOP taking these medications   cephALEXin 500 MG capsule Commonly known as:  KEFLEX     TAKE these medications   ibuprofen 200 MG tablet Commonly known as:  ADVIL,MOTRIN Take 200 mg by mouth every 6 (six) hours as needed for moderate pain.      Follow-up Information    Establish and follow up with PCP  in 2-3 weeks. Schedule an appointment as soon as possible for a visit in 2 week(s).          No Known Allergies  Consultations:  Discussed case with on-call Neurologist  Procedures/Studies: Ct Head Wo Contrast  Result Date: 05/07/2016 CLINICAL DATA:  Seizure. EXAM: CT HEAD WITHOUT CONTRAST TECHNIQUE: Contiguous axial images were obtained from the base of the skull through the vertex without intravenous contrast. COMPARISON:  None. FINDINGS: Brain: No evidence of acute infarction, hemorrhage, hydrocephalus, extra-axial collection or mass lesion/mass effect. Vascular: No hyperdense vessel or unexpected calcification. Skull: Normal. Negative for fracture or focal lesion. Sinuses/Orbits: No acute finding. Other: None. IMPRESSION: No acute abnormalities. Electronically Signed   By: Gerome Samavid  Williams III M.D   On: 05/07/2016 18:46   Mr Brain Wo Contrast  Result Date: 05/07/2016 CLINICAL DATA:  31 y/o  F; seizure. EXAM: MRI HEAD WITHOUT CONTRAST TECHNIQUE: Multiplanar, multiecho pulse sequences of the brain and surrounding structures were obtained without intravenous contrast. COMPARISON:  05/07/2016 CT of the head. FINDINGS: Brain: No acute infarction, hemorrhage, hydrocephalus, extra-axial collection or mass lesion. Midline structures are intact including a morphologically normal pituitary as well as a complete corpus callosum and vermis. No Chiari malformation. No evidence of cortical dysplasia, heterotopia, or disorder of cortical formation is identified. Hippocampi by are symmetric in size and signal bilaterally. Vascular: Normal flow voids. Skull and upper cervical spine: Normal marrow signal. Sinuses/Orbits: Negative. Other: None. IMPRESSION: 1. No structural cause of seizures identified. 2. Unremarkable MRI of the brain for age. Electronically Signed   By: Buzzy HanLance  Furusawa-Stratton M.D.  On: 05/07/2016 23:06    Subjective: No complaints  Discharge Exam: Vitals:   05/09/16 0011 05/09/16 0559   BP: (!) 128/99 126/81  Pulse: (!) 115 (!) 112  Resp: 18 18  Temp: 98 F (36.7 C) 98 F (36.7 C)   Vitals:   05/08/16 1308 05/08/16 1859 05/09/16 0011 05/09/16 0559  BP: (!) 139/107 (!) 145/106 (!) 128/99 126/81  Pulse: 95 (!) 104 (!) 115 (!) 112  Resp:  19 18 18   Temp:  98.2 F (36.8 C) 98 F (36.7 C) 98 F (36.7 C)  TempSrc:  Oral Oral Oral  SpO2:  100%  99%  Weight:      Height:        General: Pt is alert, awake, not in acute distress Cardiovascular: RRR, S1/S2 +, no rubs, no gallops Respiratory: CTA bilaterally, no wheezing, no rhonchi Abdominal: Soft, NT, ND, bowel sounds + Extremities: no edema, no cyanosis   The results of significant diagnostics from this hospitalization (including imaging, microbiology, ancillary and laboratory) are listed below for reference.     Microbiology: No results found for this or any previous visit (from the past 240 hour(s)).   Labs: BNP (last 3 results) No results for input(s): BNP in the last 8760 hours. Basic Metabolic Panel:  Recent Labs Lab 05/07/16 1741 05/09/16 0413  NA 135 136  K 3.4* 4.1  CL 98* 100*  CO2 18* 24  GLUCOSE 152* 95  BUN 6 7  CREATININE 0.57 0.62  CALCIUM 9.7 10.2  MG 1.8  --    Liver Function Tests:  Recent Labs Lab 05/07/16 1741  AST 153*  ALT 98*  ALKPHOS 59  BILITOT 0.9  PROT 8.3*  ALBUMIN 5.6*   No results for input(s): LIPASE, AMYLASE in the last 168 hours. No results for input(s): AMMONIA in the last 168 hours. CBC:  Recent Labs Lab 05/07/16 1741  WBC 5.0  NEUTROABS 2.9  HGB 13.9  HCT 40.6  MCV 95.3  PLT 169   Cardiac Enzymes:  Recent Labs Lab 05/07/16 1741  CKTOTAL 182   BNP: Invalid input(s): POCBNP CBG: No results for input(s): GLUCAP in the last 168 hours. D-Dimer No results for input(s): DDIMER in the last 72 hours. Hgb A1c No results for input(s): HGBA1C in the last 72 hours. Lipid Profile No results for input(s): CHOL, HDL, LDLCALC, TRIG, CHOLHDL,  LDLDIRECT in the last 72 hours. Thyroid function studies  Recent Labs  05/08/16 1627  TSH 1.456   Anemia work up No results for input(s): VITAMINB12, FOLATE, FERRITIN, TIBC, IRON, RETICCTPCT in the last 72 hours. Urinalysis    Component Value Date/Time   COLORURINE STRAW (A) 05/07/2016 1740   APPEARANCEUR CLEAR 05/07/2016 1740   LABSPEC 1.011 05/07/2016 1740   PHURINE 6.0 05/07/2016 1740   GLUCOSEU NEGATIVE 05/07/2016 1740   HGBUR MODERATE (A) 05/07/2016 1740   BILIRUBINUR NEGATIVE 05/07/2016 1740   KETONESUR 20 (A) 05/07/2016 1740   PROTEINUR 30 (A) 05/07/2016 1740   NITRITE NEGATIVE 05/07/2016 1740   LEUKOCYTESUR NEGATIVE 05/07/2016 1740   Sepsis Labs Invalid input(s): PROCALCITONIN,  WBC,  LACTICIDVEN Microbiology No results found for this or any previous visit (from the past 240 hour(s)).   SIGNED:   Jerald Kief, MD  [facility] 05/09/2016, 9:17 AM  If 7PM-7AM, please contact night-coverage [email or web address] Password [email or web address]

## 2016-05-09 NOTE — Progress Notes (Signed)
NURSING PROGRESS NOTE  Angela Palmer Recht [id number] Discharge Data: 05/09/2016 3:21 PM Attending Provider: Jerald KiefStephen K Chiu, MD PCP:No primary care provider on file.     Angela LindenAshton Palmer Compean to be D/C'd Home per MD order.  Discussed with the patient the After Visit Summary and all questions fully answered. All IV's discontinued with no bleeding noted. All belongings returned to patient for patient to take home.   Last Vital Signs:  Blood pressure 126/81, pulse (!) 112, temperature 98 F (36.7 C), temperature source Oral, resp. rate 18, height 5\' 5"  (1.651 m), weight 59 kg (130 lb), last menstrual period 04/06/2016, SpO2 99 %.  Discharge Medication List Allergies as of 05/09/2016   No Known Allergies     Medication List    STOP taking these medications   cephALEXin 500 MG capsule Commonly known as:  KEFLEX     TAKE these medications   ibuprofen 200 MG tablet Commonly known as:  ADVIL,MOTRIN Take 200 mg by mouth every 6 (six) hours as needed for moderate pain.

## 2016-08-28 ENCOUNTER — Emergency Department
Admission: EM | Admit: 2016-08-28 | Discharge: 2016-08-28 | Disposition: A | Discharge status: Home / Self Care | Payer: 59 | Attending: Emergency Medicine | Admitting: Emergency Medicine

## 2016-08-28 ENCOUNTER — Encounter: Payer: Self-pay | Admitting: *Deleted

## 2016-08-28 DIAGNOSIS — F1020 Alcohol dependence, uncomplicated: Secondary | ICD-10-CM | POA: Diagnosis not present

## 2016-08-28 DIAGNOSIS — Z791 Long term (current) use of non-steroidal anti-inflammatories (NSAID): Secondary | ICD-10-CM | POA: Insufficient documentation

## 2016-08-28 DIAGNOSIS — F1010 Alcohol abuse, uncomplicated: Secondary | ICD-10-CM | POA: Diagnosis present

## 2016-08-28 HISTORY — DX: Unspecified convulsions: R56.9

## 2016-08-28 LAB — POCT PREGNANCY, URINE: Preg Test, Ur: NEGATIVE

## 2016-08-28 MED ORDER — CHLORDIAZEPOXIDE HCL 25 MG PO CAPS
50.0000 mg | ORAL_CAPSULE | Freq: Once | ORAL | Status: AC
  Administered 2016-08-28: 50 mg via ORAL
  Filled 2016-08-28: qty 2

## 2016-08-28 MED ORDER — CHLORDIAZEPOXIDE HCL 25 MG PO CAPS: ORAL_CAPSULE | ORAL | 0 refills | Status: DC

## 2016-08-28 NOTE — BH Assessment (Signed)
Discussed patient with ER MD (Dr. Don PerkingVeronese) and patient is able to discharge home when medically cleared. Patient was giving referral information and instructions on how to follow up with Outpatient Treatment [facility]  Writer also advised the patient to call the toll free phone on his insurance card for the counselors and agencies in his network.  Patient advised to call the [employer] Program through their job to help with Outpatient Treatment.  Patient denies SI/HI and AV/H.   Outpatient Providers [facility] [address],  [place], [place] [address] Phone [phone]  [facility] [address],  [place], [place] [address] Phone: [phone]

## 2016-08-28 NOTE — ED Notes (Signed)
Calvin "TTS" at bedside

## 2016-08-28 NOTE — ED Triage Notes (Signed)
States she is seeing help to stop drinking, states last drink was last night, drinks liquor, states she has been drinking for 2 years, at present denies any drug use, denies any hallucinations or headache, denies SI, no tremors noticed

## 2016-08-28 NOTE — ED Provider Notes (Signed)
[facility] Emergency Department Provider Note  ____________________________________________  Time seen: Approximately 12:31 PM  I have reviewed the triage vital signs and the nursing notes.   HISTORY  Chief Complaint Alcohol Problem   HPI Angela Palmer is a 31 y.o. female with a history of alcohol abuse who presents requesting alcohol detox. Patient has h/o severe alcohol abuse. She reports that she drinks a fifth of vodka a day for 2 years. Last drink was yesterday evening. She has had a prior episode of seizure requiring admission [facility] however she reports that at that time she was under the influence and not detoxing. She denies drug use or smoking. She denies hallucinations, headaches, anxiety, suicidal ideation, homicidal ideation, tremors, diarrhea, nausea, vomiting.  Past Medical History:  Diagnosis Date  . Seizures [facility](HCC)     Patient Active Problem List   Diagnosis Date Noted  . Seizure (HCC) 05/07/2016  . Alcohol abuse 05/07/2016  . Hypokalemia 05/07/2016  . Transaminitis 05/07/2016  . Metabolic acidosis 05/07/2016    History reviewed. No pertinent surgical history.  Prior to Admission medications   Medication Sig Start Date End Date Taking? Authorizing Provider  chlordiazePOXIDE (LIBRIUM) 25 MG capsule 50 mg 3 times a day x 3 days 50 mg 2 times a day for 3 days 50 mg once a day for 3 days stop 08/28/16   Don PerkingVeronese, [place], MD  ibuprofen (ADVIL,MOTRIN) 200 MG tablet Take 200 mg by mouth every 6 (six) hours as needed for moderate pain.    [provider]    Allergies Patient has no known allergies.  History reviewed. No pertinent family history.  Social History Social History  Substance Use Topics  . Smoking status: Never Smoker  . Smokeless tobacco: Never Used  . Alcohol use 0.0 oz/week    Review of Systems  Constitutional: Negative for fever. Eyes: Negative for visual changes. ENT: Negative for sore  throat. Neck: No neck pain  Cardiovascular: Negative for chest pain. Respiratory: Negative for shortness of breath. Gastrointestinal: Negative for abdominal pain, vomiting or diarrhea. Genitourinary: Negative for dysuria. Musculoskeletal: Negative for back pain. Skin: Negative for rash. Neurological: Negative for headaches, weakness or numbness. Psych: No SI or HI  ____________________________________________   PHYSICAL EXAM:  VITAL SIGNS: ED Triage Vitals [08/28/16 1034]  Enc Vitals Group     BP (!) 172/117     Pulse Rate (!) 121     Resp 16     Temp 98.6 F (37 C)     Temp Source Oral     SpO2 97 %     Weight 130 lb (59 kg)     Height 5\' 5"  (1.651 m)     Head Circumference      Peak Flow      Pain Score      Pain Loc      Pain Edu?      Excl. in GC?     Constitutional: Alert and oriented. Well appearing and in no apparent distress. HEENT:      Head: Normocephalic and atraumatic.         Eyes: Conjunctivae are normal. Sclera is non-icteric. EOMI. PERRL      Mouth/Throat: Mucous membranes are moist.       Neck: Supple with no signs of meningismus. Cardiovascular: Regular rate and rhythm. No murmurs, gallops, or rubs. 2+ symmetrical distal pulses are present in all extremities. No JVD. Respiratory: Normal respiratory effort. Lungs are clear to auscultation bilaterally.  No wheezes, crackles, or rhonchi.  Gastrointestinal: Soft, non tender, and non distended with positive bowel sounds. No rebound or guarding. Genitourinary: No CVA tenderness. Musculoskeletal: Nontender with normal range of motion in all extremities. No edema, cyanosis, or erythema of extremities. Neurologic: Normal speech and language. Face is symmetric. Moving all extremities. No gross focal neurologic deficits are appreciated. Skin: Skin is warm, dry and intact. No rash noted. Psychiatric: Mood and affect are normal. Speech and behavior are normal.  ____________________________________________     LABS (all labs ordered are listed, but only abnormal results are displayed)  Labs Reviewed  POCT PREGNANCY, URINE   ____________________________________________  EKG  none ____________________________________________  RADIOLOGY  none  ____________________________________________   PROCEDURES  Procedure(s) performed: None Procedures Critical Care performed:  None ____________________________________________   INITIAL IMPRESSION / ASSESSMENT AND PLAN / ED COURSE  31 y.o. female with a history of alcohol abuse who presents requesting alcohol detox. Patient had one prior episode of seizure however under the influence of alcohol at that time no other complications from detoxing. At this time her CIWA score is a 1. Will stat patient on librium and consult TTS to help find bed for detox for patient.    _________________________ 3:40 PM on 08/28/2016 -----------------------------------------  Patient given resources For outpatient management. Patient remains with no evidence of withdrawal. She was given a prescription for Librium taper. I discussed with the patient dangers of mixing alcohol and librium and recommended that she does not take the librium if she relapses.   Pertinent labs & imaging results that were available during my care of the patient were reviewed by me and considered in my medical decision making (see chart for details).    ____________________________________________   FINAL CLINICAL IMPRESSION(S) / ED DIAGNOSES  Final diagnoses:  Uncomplicated alcohol dependence (HCC)      NEW MEDICATIONS STARTED DURING THIS VISIT:  Discharge Medication List as of 08/28/2016  2:56 PM    START taking these medications   Details  chlordiazePOXIDE (LIBRIUM) 25 MG capsule 50 mg 3 times a day x 3 days 50 mg 2 times a day for 3 days 50 mg once a day for 3 days stop, Print         Note:  This document was prepared using Dragon voice recognition software and  may include unintentional dictation errors.    Don Perking, [place], MD 08/28/16 [phone]

## 2016-08-28 NOTE — ED Notes (Signed)
Pt reports drinks about 1/5 liquor daily. Last drink was 10pm last night.

## 2016-08-28 NOTE — ED Notes (Signed)
Calvin (TTS) called, will be down to see pt shortly.

## 2016-11-10 ENCOUNTER — Encounter (HOSPITAL_COMMUNITY): Payer: Self-pay

## 2016-11-10 ENCOUNTER — Emergency Department (HOSPITAL_COMMUNITY)
Admission: EM | Admit: 2016-11-10 | Discharge: 2016-11-10 | Disposition: A | Discharge status: Home / Self Care | Payer: 59 | Attending: Emergency Medicine | Admitting: Emergency Medicine

## 2016-11-10 DIAGNOSIS — Z041 Encounter for examination and observation following transport accident: Secondary | ICD-10-CM | POA: Insufficient documentation
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
## 2016-11-10 NOTE — ED Triage Notes (Signed)
[facility].

## 2016-11-10 NOTE — Discharge Instructions (Signed)
Your exam did not show signs of serious traumatic injuries tonight. We feel you are safe for discharge from the emergency department. Please use over-the-counter Tylenol and Motrin to help with the aching and pain you have been experiencing. Your pain may worsen over the next several days as you develop more soreness. Please stay hydrated. Please follow-up with your primary care physician in the next several days for reevaluation and follow-up. She develop any new or worsened symptoms including difficulty breathing or new pains, please return to the nearest Emergency department.

## 2017-09-26 ENCOUNTER — Encounter (HOSPITAL_COMMUNITY): Payer: Self-pay | Admitting: Nurse Practitioner

## 2017-09-26 ENCOUNTER — Inpatient Hospital Stay (HOSPITAL_COMMUNITY)
Admission: EM | Admit: 2017-09-26 | Discharge: 2017-10-01 | DRG: 101 | Disposition: A | Discharge status: Home / Self Care | Payer: 59 | Attending: Family Medicine | Admitting: Family Medicine

## 2017-09-26 DIAGNOSIS — R740 Nonspecific elevation of levels of transaminase and lactic acid dehydrogenase [LDH]: Secondary | ICD-10-CM | POA: Diagnosis present

## 2017-09-26 DIAGNOSIS — F10939 Alcohol use, unspecified with withdrawal, unspecified: Secondary | ICD-10-CM

## 2017-09-26 DIAGNOSIS — F10239 Alcohol dependence with withdrawal, unspecified: Secondary | ICD-10-CM | POA: Diagnosis present

## 2017-09-26 DIAGNOSIS — R748 Abnormal levels of other serum enzymes: Secondary | ICD-10-CM

## 2017-09-26 DIAGNOSIS — R569 Unspecified convulsions: Principal | ICD-10-CM | POA: Diagnosis present

## 2017-09-26 DIAGNOSIS — E876 Hypokalemia: Secondary | ICD-10-CM | POA: Diagnosis present

## 2017-09-26 DIAGNOSIS — F10231 Alcohol dependence with withdrawal delirium: Secondary | ICD-10-CM | POA: Diagnosis present

## 2017-09-26 DIAGNOSIS — D7589 Other specified diseases of blood and blood-forming organs: Secondary | ICD-10-CM | POA: Diagnosis present

## 2017-09-26 DIAGNOSIS — Z781 Physical restraint status: Secondary | ICD-10-CM

## 2017-09-26 LAB — I-STAT BETA HCG BLOOD, ED (MC, WL, AP ONLY): I-stat hCG, quantitative: <5 m[IU]/mL (ref <5)

## 2017-09-26 MED ORDER — LORAZEPAM 1 MG PO TABS: 0.0000 mg | ORAL_TABLET | Freq: Two times a day (BID) | ORAL | Status: DC

## 2017-09-26 MED ORDER — LORAZEPAM 2 MG/ML IJ SOLN
0.0000 mg | Freq: Four times a day (QID) | INTRAMUSCULAR | Status: DC
  Administered 2017-09-26: 2 mg via INTRAVENOUS
  Filled 2017-09-26: qty 1

## 2017-09-26 MED ORDER — SODIUM CHLORIDE 0.9 % IV BOLUS
1000.0000 mL | Freq: Once | INTRAVENOUS | Status: AC
  Administered 2017-09-26: 1000 mL via INTRAVENOUS

## 2017-09-26 MED ORDER — LORAZEPAM 2 MG/ML IJ SOLN: 0.0000 mg | Freq: Two times a day (BID) | INTRAMUSCULAR | Status: DC

## 2017-09-26 MED ORDER — LORAZEPAM 1 MG PO TABS: 0.0000 mg | ORAL_TABLET | Freq: Four times a day (QID) | ORAL | Status: DC

## 2017-09-26 MED ORDER — VITAMIN B-1 100 MG PO TABS: 100.0000 mg | ORAL_TABLET | Freq: Every day | ORAL | Status: DC

## 2017-09-26 MED ORDER — THIAMINE HCL 100 MG/ML IJ SOLN: 100.0000 mg | Freq: Every day | INTRAMUSCULAR | Status: DC
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
## 2017-09-26 NOTE — ED Notes (Signed)
PA at bedside.

## 2017-09-26 NOTE — ED Triage Notes (Signed)
Pt is presented by family who report that pt might be going through alcohol withdrawal, that she may have had seizure while at home. Pt is somewhat confused, incoherent and exhibiting tactile disturbance, reportedly had last drink yesterday morning and has hx of alcohol withdrawal seizures.

## 2017-09-27 ENCOUNTER — Other Ambulatory Visit: Payer: Self-pay

## 2017-09-27 ENCOUNTER — Encounter (HOSPITAL_COMMUNITY): Payer: Self-pay | Admitting: Family Medicine

## 2017-09-27 LAB — COMPREHENSIVE METABOLIC PANEL
ALBUMIN: 4.7 g/dL (ref 3.5–5.0)
ALK PHOS: 75 U/L (ref 38–126)
ALT: 139 U/L — AB (ref 14–54)
ALT: 115 U/L — AB (ref 14–54)
AST: 253 U/L — ABNORMAL HIGH (ref 15–41)
Albumin: 5.2 g/dL — ABNORMAL HIGH (ref 3.5–5.0)
Alkaline Phosphatase: 84 U/L (ref 38–126)
Anion gap: 21 — ABNORMAL HIGH (ref 5–15)
Anion gap: 14 (ref 5–15)
BUN: 6 mg/dL (ref 6–20)
BUN: 5 mg/dL — ABNORMAL LOW (ref 6–20)
CALCIUM: 8.9 mg/dL (ref 8.9–10.3)
CHLORIDE: 93 mmol/L — AB (ref 101–111)
CO2: 23 mmol/L (ref 22–32)
CO2: 25 mmol/L (ref 22–32)
CREATININE: 0.57 mg/dL (ref 0.44–1.00)
CREATININE: 0.5 mg/dL (ref 0.44–1.00)
Calcium: 9.8 mg/dL (ref 8.9–10.3)
Chloride: 96 mmol/L — ABNORMAL LOW (ref 101–111)
GFR calc Af Amer: >60 mL/min (ref >60)
GFR calc non Af Amer: >60 mL/min (ref >60)
GFR calc non Af Amer: >60 mL/min (ref >60)
GLUCOSE: 97 mg/dL (ref 65–99)
Glucose, Bld: 138 mg/dL — ABNORMAL HIGH (ref 65–99)
Potassium: 3.4 mmol/L — ABNORMAL LOW (ref 3.5–5.1)
Potassium: 3.5 mmol/L (ref 3.5–5.1)
SODIUM: 137 mmol/L (ref 135–145)
SODIUM: 135 mmol/L (ref 135–145)
Total Bilirubin: 1.4 mg/dL — ABNORMAL HIGH (ref 0.3–1.2)
Total Bilirubin: 1.7 mg/dL — ABNORMAL HIGH (ref 0.3–1.2)
Total Protein: 8.6 g/dL — ABNORMAL HIGH (ref 6.5–8.1)
Total Protein: 7.8 g/dL (ref 6.5–8.1)

## 2017-09-27 LAB — URINALYSIS, ROUTINE W REFLEX MICROSCOPIC
BILIRUBIN URINE: NEGATIVE
Glucose, UA: NEGATIVE mg/dL
KETONES UR: 80 mg/dL — AB
LEUKOCYTES UA: NEGATIVE
Nitrite: NEGATIVE
PH: 8 (ref 5.0–8.0)
Protein, ur: 100 mg/dL — AB
SPECIFIC GRAVITY, URINE: 1.017 (ref 1.005–1.030)

## 2017-09-27 LAB — CBC WITH DIFFERENTIAL/PLATELET
Basophils Absolute: 0.1 10*3/uL (ref 0.0–0.1)
Basophils Relative: 1 %
EOS PCT: 0 %
Eosinophils Absolute: 0 10*3/uL (ref 0.0–0.7)
HCT: 40.9 % (ref 36.0–46.0)
Hemoglobin: 13.8 g/dL (ref 12.0–15.0)
LYMPHS ABS: 0.5 10*3/uL — AB (ref 0.7–4.0)
LYMPHS PCT: 9 %
MCH: 33.8 pg (ref 26.0–34.0)
MCHC: 33.7 g/dL (ref 30.0–36.0)
MCV: 100.2 fL — AB (ref 78.0–100.0)
Monocytes Absolute: 0.5 10*3/uL (ref 0.1–1.0)
Monocytes Relative: 8 %
Neutro Abs: 4.5 10*3/uL (ref 1.7–7.7)
Neutrophils Relative %: 82 %
PLATELETS: 159 10*3/uL (ref 150–400)
RBC: 4.08 MIL/uL (ref 3.87–5.11)
RDW: 12.7 % (ref 11.5–15.5)
WBC: 5.5 10*3/uL (ref 4.0–10.5)

## 2017-09-27 LAB — CBC
HCT: 37.9 % (ref 36.0–46.0)
Hemoglobin: 12.7 g/dL (ref 12.0–15.0)
MCH: 33.8 pg (ref 26.0–34.0)
MCHC: 33.5 g/dL (ref 30.0–36.0)
MCV: 100.8 fL — AB (ref 78.0–100.0)
PLATELETS: 135 10*3/uL — AB (ref 150–400)
RBC: 3.76 MIL/uL — ABNORMAL LOW (ref 3.87–5.11)
RDW: 12.8 % (ref 11.5–15.5)
WBC: 6.6 10*3/uL (ref 4.0–10.5)

## 2017-09-27 LAB — MAGNESIUM
MAGNESIUM: 1.5 mg/dL — AB (ref 1.7–2.4)
Magnesium: 2.6 mg/dL — ABNORMAL HIGH (ref 1.7–2.4)

## 2017-09-27 LAB — LIPASE, BLOOD: Lipase: 62 U/L — ABNORMAL HIGH (ref 11–51)

## 2017-09-27 LAB — RAPID URINE DRUG SCREEN, HOSP PERFORMED
Amphetamines: NOT DETECTED
BARBITURATES: NOT DETECTED
Benzodiazepines: NOT DETECTED
COCAINE: NOT DETECTED
Opiates: NOT DETECTED
TETRAHYDROCANNABINOL: NOT DETECTED

## 2017-09-27 LAB — MRSA PCR SCREENING: MRSA by PCR: NEGATIVE

## 2017-09-27 LAB — HIV ANTIBODY (ROUTINE TESTING W REFLEX): HIV Screen 4th Generation wRfx: NONREACTIVE

## 2017-09-27 LAB — ETHANOL

## 2017-09-27 MED ORDER — ACETAMINOPHEN 325 MG PO TABS
650.0000 mg | ORAL_TABLET | Freq: Four times a day (QID) | ORAL | Status: DC | PRN
  Administered 2017-09-30: 650 mg via ORAL
  Filled 2017-09-27: qty 2

## 2017-09-27 MED ORDER — FOLIC ACID 1 MG PO TABS
1.0000 mg | ORAL_TABLET | Freq: Every day | ORAL | Status: DC
  Administered 2017-09-27 – 2017-10-01 (×5): 1 mg via ORAL
  Filled 2017-09-27 (×5): qty 1

## 2017-09-27 MED ORDER — SODIUM CHLORIDE 0.9% FLUSH
3.0000 mL | Freq: Two times a day (BID) | INTRAVENOUS | Status: DC
  Administered 2017-09-27 – 2017-09-30 (×5): 3 mL via INTRAVENOUS

## 2017-09-27 MED ORDER — ONDANSETRON HCL 4 MG/2ML IJ SOLN: 4.0000 mg | Freq: Four times a day (QID) | INTRAMUSCULAR | Status: DC | PRN

## 2017-09-27 MED ORDER — POTASSIUM CHLORIDE IN NACL 20-0.9 MEQ/L-% IV SOLN
INTRAVENOUS | Status: AC
  Administered 2017-09-27 (×2): via INTRAVENOUS
  Filled 2017-09-27 (×3): qty 1000

## 2017-09-27 MED ORDER — POTASSIUM CHLORIDE CRYS ER 20 MEQ PO TBCR
40.0000 meq | EXTENDED_RELEASE_TABLET | Freq: Once | ORAL | Status: AC
  Administered 2017-09-27: 40 meq via ORAL
  Filled 2017-09-27: qty 2

## 2017-09-27 MED ORDER — HYDROCODONE-ACETAMINOPHEN 5-325 MG PO TABS: 1.0000 | ORAL_TABLET | ORAL | Status: DC | PRN

## 2017-09-27 MED ORDER — LORAZEPAM 2 MG/ML IJ SOLN
2.0000 mg | INTRAMUSCULAR | Status: DC | PRN
  Administered 2017-09-27: 2 mg via INTRAVENOUS
  Administered 2017-09-29: 3 mg via INTRAVENOUS
  Administered 2017-09-29 (×2): 2 mg via INTRAVENOUS
  Filled 2017-09-27 (×4): qty 1
  Filled 2017-09-27: qty 2

## 2017-09-27 MED ORDER — SENNOSIDES-DOCUSATE SODIUM 8.6-50 MG PO TABS: 1.0000 | ORAL_TABLET | Freq: Every evening | ORAL | Status: DC | PRN

## 2017-09-27 MED ORDER — ACETAMINOPHEN 650 MG RE SUPP: 650.0000 mg | Freq: Four times a day (QID) | RECTAL | Status: DC | PRN

## 2017-09-27 MED ORDER — MAGNESIUM SULFATE 2 GM/50ML IV SOLN
2.0000 g | Freq: Once | INTRAVENOUS | Status: AC
  Administered 2017-09-27: 2 g via INTRAVENOUS
  Filled 2017-09-27: qty 50

## 2017-09-27 MED ORDER — ONDANSETRON HCL 4 MG PO TABS: 4.0000 mg | ORAL_TABLET | Freq: Four times a day (QID) | ORAL | Status: DC | PRN

## 2017-09-27 MED ORDER — ADULT MULTIVITAMIN W/MINERALS CH
1.0000 | ORAL_TABLET | Freq: Every day | ORAL | Status: DC
  Administered 2017-09-27 – 2017-10-01 (×5): 1 via ORAL
  Filled 2017-09-27 (×5): qty 1

## 2017-09-27 MED ORDER — VITAMIN B-1 100 MG PO TABS
100.0000 mg | ORAL_TABLET | Freq: Every day | ORAL | Status: DC
  Administered 2017-09-27 – 2017-10-01 (×5): 100 mg via ORAL
  Filled 2017-09-27 (×5): qty 1

## 2017-09-27 MED ORDER — BISACODYL 5 MG PO TBEC: 5.0000 mg | DELAYED_RELEASE_TABLET | Freq: Every day | ORAL | Status: DC | PRN

## 2017-09-27 NOTE — ED Notes (Signed)
Pt. Made aware for the need of urine specimen. 

## 2017-09-27 NOTE — H&P (Signed)
History and Physical    Lyndell Gillyard ZOX:[id number] DOB: 1985/11/13 DOA: 09/26/2017  PCP: Deatra James, MD   Patient coming from: Home  Chief Complaint: Seizure   HPI: Angela Palmer is a 32 y.o. female with medical history significant for severe alcohol dependence with history of withdrawal seizure, now presenting to the emergency department after a seizure at home in the setting of alcohol withdrawal.  Patient reports that she typically drinks 750 mL of liquor daily, but had not had any alcohol today.  She had some nausea, sweating, tremulousness, and was then observed to have generalized seizure-like activity by family.  She reports biting her tongue during this episode, but denies suffering any other injury, denies headache, denies focal numbness or weakness, and denies any recent fevers, chills, cough, shortness of breath, or chest pain.  No recent fall or trauma.  ED Course: Upon arrival to the ED, patient is found to be afebrile, saturating well on room air, slightly tachypneic, tachycardic in the 110s, and with stable blood pressure.  EKG features a sinus tachycardia with rate 103 and QTc interval 507 ms.  Chemistry panel is notable for potassium 3.4, magnesium 1.5, and ALT 139.  CBC features a mild macrocytosis without anemia.  Patient was treated with 1 L of normal saline, 40 mEq oral potassium, 2 g IV magnesium, and Ativan in the ED.  She remains slightly tachycardic and tremulous, and will be admitted for ongoing evaluation and management of dependence with complicated withdrawal.  Review of Systems:  All other systems reviewed and apart from HPI, are negative.  Past Medical History:  Diagnosis Date  . Seizures (HCC)     History reviewed. No pertinent surgical history.   reports that she has never smoked. She has never used smokeless tobacco. She reports that she drinks alcohol. She reports that she does not use drugs.  Allergies  Allergen Reactions  . Minocycline  Swelling    Swelling of the face     History reviewed. No pertinent family history.   Prior to Admission medications   Not on File    Physical Exam: Vitals:   09/26/17 2328 09/27/17 0000 09/27/17 0030 09/27/17 0100  BP: (!) 143/102 (!) 135/95 (!) 134/91 (!) 139/91  Pulse: (!) 109 (!) 103 (!) 114 (!) 113  Resp:  18 20 (!) 22  Temp:      TempSrc:      SpO2:  98% 98% 98%      Constitutional: No respiratory distress, anxious, tremulous Eyes: PERTLA, lids and conjunctivae normal ENMT: Mucous membranes are moist. Posterior pharynx clear of any exudate or lesions.   Neck: normal, supple, no masses, no thyromegaly Respiratory: clear to auscultation bilaterally, no wheezing, no crackles. Normal respiratory effort.  Cardiovascular: Rate ~120 and regular. No extremity edema.   Abdomen: No distension, no tenderness, soft. Bowel sounds normal.  Musculoskeletal: no clubbing / cyanosis. No joint deformity upper and lower extremities.  Skin: no significant rashes, lesions, ulcers. Warm, dry, well-perfused. Neurologic: CN 2-12 grossly intact. Sensation intact, DTR normal. Strength 5/5 in all 4 limbs. Coarse resting tremor.  Psychiatric: Alert and to person, place, and situation.  Anxious, cooperative.   Labs on Admission: I have personally reviewed following labs and imaging studies  CBC: Recent Labs  Lab 09/26/17 2328  WBC 5.5  NEUTROABS 4.5  HGB 13.8  HCT 40.9  MCV 100.2*  PLT 159   Basic Metabolic Panel: Recent Labs  Lab 09/26/17 2328 09/26/17 2343  NA 137  --  K 3.4*  --   CL 93*  --   CO2 23  --   GLUCOSE 138*  --   BUN 6  --   CREATININE 0.57  --   CALCIUM 9.8  --   MG  --  1.5*   GFR: CrCl cannot be calculated (Unknown ideal weight.). Liver Function Tests: Recent Labs  Lab 09/26/17 2328  AST <5*  ALT 139*  ALKPHOS 84  BILITOT 1.4*  PROT 8.6*  ALBUMIN 5.2*   Recent Labs  Lab 09/26/17 2328  LIPASE 62*   No results for input(s): AMMONIA in the  last 168 hours. Coagulation Profile: No results for input(s): INR, PROTIME in the last 168 hours. Cardiac Enzymes: No results for input(s): CKTOTAL, CKMB, CKMBINDEX, TROPONINI in the last 168 hours. BNP (last 3 results) No results for input(s): PROBNP in the last 8760 hours. HbA1C: No results for input(s): HGBA1C in the last 72 hours. CBG: No results for input(s): GLUCAP in the last 168 hours. Lipid Profile: No results for input(s): CHOL, HDL, LDLCALC, TRIG, CHOLHDL, LDLDIRECT in the last 72 hours. Thyroid Function Tests: No results for input(s): TSH, T4TOTAL, FREET4, T3FREE, THYROIDAB in the last 72 hours. Anemia Panel: No results for input(s): VITAMINB12, FOLATE, FERRITIN, TIBC, IRON, RETICCTPCT in the last 72 hours. Urine analysis:    Component Value Date/Time   COLORURINE STRAW (A) 05/07/2016 1740   APPEARANCEUR CLEAR 05/07/2016 1740   LABSPEC 1.011 05/07/2016 1740   PHURINE 6.0 05/07/2016 1740   GLUCOSEU NEGATIVE 05/07/2016 1740   HGBUR MODERATE (A) 05/07/2016 1740   BILIRUBINUR NEGATIVE 05/07/2016 1740   KETONESUR 20 (A) 05/07/2016 1740   PROTEINUR 30 (A) 05/07/2016 1740   NITRITE NEGATIVE 05/07/2016 1740   LEUKOCYTESUR NEGATIVE 05/07/2016 1740   Sepsis Labs: @LABRCNTIP (procalcitonin:4,lacticidven:4) )No results found for this or any previous visit (from the past 240 hour(s)).   Radiological Exams on Admission: No results found.  EKG: Independently reviewed. Sinus tachycardia (rate 103), QTc 507 ms.   Assessment/Plan   1. Alcohol dependence with withdrawal seizures - Presents following generalized seizure in setting of alcohol withdrawal  - She has hx of severe alcohol dependence and withdrawal seizure, expresses desire to quit at this point  - No focal findings on exam, has minor tongue laceration but no other injury identified  - Continue CIWA with prn Ativan, replete electrolytes, supplement B-vitamins    2. Hypokalemia; hypomagnesemia  - Serum potassium is  3.4 and magnesium is 1.5 on adimssion  - Likely secondary to alcoholism  - Replaced in ED with 40 mEq oral potassium and 2 mg IV mag  - Continue cardiac monitoring and repeat chem panel in am    3. Prolonged QT interval  - QTc is 507 ms on admission EKG  - Likely secondary to #2  - Anticipate normalization with electrolyte repletion - Continue cardiac monitoring, avoid offending agents    DVT prophylaxis: SCD's  Code Status: Full  Family Communication: Discussed with patient Consults called: none Admission status: Observation    Briscoe Deutscherimothy S Opyd, MD [facility] Pager [phone]  If 7PM-7AM, please contact night-coverage [email or web address] Password [email or web address]  09/27/2017, 1:39 AM

## 2017-09-27 NOTE — Progress Notes (Signed)
Pt. States she stopped drinking to take some classes on alcohol and drug abuse. She explained that these classes do not allow substance abuse and they require you to stop drinking. She further explained that these classes will help in a court case she is involved in. She states she does not have time for a rehab program because of these classes.

## 2017-09-27 NOTE — Progress Notes (Signed)
The patient was evaluated. Has no new complaints. Here for seizure 2ary to alcohol withdrawal given history of alcohol dependence.  Continue CIWA protocol.  Gen: pt in nad, alert and awake CV: no cyanosis Pulm: no increased wob  Will reassess next am, continue current order set.  Penny Piarlando Jourdan Durbin, MD

## 2017-09-27 NOTE — ED Notes (Signed)
ED TO INPATIENT HANDOFF REPORT  Name/Age/Gender Lowella Curb 32 y.o. female  Code Status    Code Status Orders  (From admission, onward)        Start     Ordered   09/27/17 0137  Full code  Continuous     09/27/17 0139    Code Status History    Date Active Date Inactive Code Status Order ID Comments User Context   05/07/2016 2007 05/09/2016 1852 Full Code [id number]  Norval Morton, MD ED      Home/SNF/Other Home  Chief Complaint Seizures   Level of Care/Admitting Diagnosis ED Disposition    ED Disposition Condition [facility] Area: [facility] [[id number]]  Level of Care: Stepdown [14]  Admit to SDU based on following criteria: Severe physiological/psychological symptoms:  Any diagnosis requiring assessment & intervention at least every 4 hours on an ongoing basis to obtain desired patient outcomes including stability and rehabilitation  Diagnosis: Alcohol dependence with withdrawal with complication [facility]) [[id number]]  Admitting Physician: Vianne Bulls [[id number]]  Attending Physician: Vianne Bulls [[id number]]  PT Class (Do Not Modify): Observation [104]  PT Acc Code (Do Not Modify): Observation [10022]       Medical History Past Medical History:  Diagnosis Date  . Seizures (HCC)     Allergies Allergies  Allergen Reactions  . Minocycline Swelling    Swelling of the face     IV Location/Drains/Wounds Patient Lines/Drains/Airways Status   Active Line/Drains/Airways    None          Labs/Imaging Results for orders placed or performed during the hospital encounter of 09/26/17 (from the past 48 hour(s))  CBC with Differential     Status: Abnormal   Collection Time: 09/26/17 11:28 PM  Result Value Ref Range   WBC 5.5 4.0 - 10.5 K/uL   RBC 4.08 3.87 - 5.11 MIL/uL   Hemoglobin 13.8 12.0 - 15.0 g/dL   HCT 40.9 36.0 - 46.0 %   MCV 100.2 (H) 78.0 - 100.0 fL   MCH 33.8 26.0 - 34.0 pg   MCHC 33.7 30.0 - 36.0 g/dL   RDW 12.7 11.5 - 15.5 %   Platelets 159 150 - 400 K/uL   Neutrophils Relative % 82 %   Neutro Abs 4.5 1.7 - 7.7 K/uL   Lymphocytes Relative 9 %   Lymphs Abs 0.5 (L) 0.7 - 4.0 K/uL   Monocytes Relative 8 %   Monocytes Absolute 0.5 0.1 - 1.0 K/uL   Eosinophils Relative 0 %   Eosinophils Absolute 0.0 0.0 - 0.7 K/uL   Basophils Relative 1 %   Basophils Absolute 0.1 0.0 - 0.1 K/uL    Comment: Performed at [facility], [place] [address] [place], [place] [address]  Comprehensive metabolic panel     Status: Abnormal   Collection Time: 09/26/17 11:28 PM  Result Value Ref Range   Sodium 137 135 - 145 mmol/L   Potassium 3.4 (L) 3.5 - 5.1 mmol/L   Chloride 93 (L) 101 - 111 mmol/L   CO2 23 22 - 32 mmol/L   Glucose, Bld 138 (H) 65 - 99 mg/dL   BUN 6 6 - 20 mg/dL   Creatinine, Ser 0.57 0.44 - 1.00 mg/dL   Calcium 9.8 8.9 - 10.3 mg/dL   Total Protein 8.6 (H) 6.5 - 8.1 g/dL   Albumin 5.2 (H) 3.5 - 5.0 g/dL   AST <5 (L) 15 - 41 U/L  ALT 139 (H) 14 - 54 U/L   Alkaline Phosphatase 84 38 - 126 U/L   Total Bilirubin 1.4 (H) 0.3 - 1.2 mg/dL   GFR calc non Af Amer >60 >60 mL/min   GFR calc Af Amer >60 >60 mL/min    Comment: (NOTE) The eGFR has been calculated using the CKD EPI equation. This calculation has not been validated in all clinical situations. eGFR's persistently <60 mL/min signify possible Chronic Kidney Disease.    Anion gap 21 (H) 5 - 15    Comment: REPEATED TO VERIFY Performed at [facility], [place] [address] [place], [place] [address]   Lipase, blood     Status: Abnormal   Collection Time: 09/26/17 11:28 PM  Result Value Ref Range   Lipase 62 (H) 11 - 51 U/L    Comment: Performed at [facility], [place] [address] [place], [place] [address]  Magnesium     Status: Abnormal   Collection Time: 09/26/17 11:43 PM  Result Value Ref Range   Magnesium 1.5 (L) 1.7 - 2.4 mg/dL    Comment: Performed at [facility], [place] [address], [place], [place] [address]  I-Stat Beta hCG blood, ED ([facility], [facility], [facility] only)     Status: None   Collection Time: 09/26/17 11:47 PM  Result Value Ref Range   I-stat hCG, quantitative <5.0 <5 mIU/mL   Comment 3            Comment:   GEST. AGE      CONC.  (mIU/mL)   <=1 WEEK        5 - 50     2 WEEKS       50 - 500     3 WEEKS       100 - 10,000     4 WEEKS     1,000 - 30,000        FEMALE AND NON-PREGNANT FEMALE:     LESS THAN 5 mIU/mL    No results found.  Pending Labs Unresulted Labs (From admission, onward)   Start     Ordered   09/27/17 0500  HIV antibody (Routine Testing)  Tomorrow morning,   R     09/27/17 0139   09/27/17 0500  Comprehensive metabolic panel  Tomorrow morning,   R     09/27/17 0139   09/27/17 0500  Magnesium  Tomorrow morning,   R     09/27/17 0139   09/27/17 0500  CBC  Tomorrow morning,   R     09/27/17 0139   09/27/17 0003  Ethanol  Once,   STAT     09/27/17 0003   09/26/17 2328  Urinalysis, Routine w reflex microscopic  Once,   R     09/26/17 2327   09/26/17 2328  Rapid urine drug screen (hospital performed)  STAT,   R     09/26/17 2327      Vitals/Pain Today's Vitals   09/27/17 0030 09/27/17 0100 09/27/17 0130 09/27/17 0145  BP: (!) 134/91 (!) 139/91 (!) 130/101 (!) 130/101  Pulse: (!) 114 (!) 113 (!) 157 (!) 139  Resp: 20 (!) 22 19   Temp:      TempSrc:      SpO2: 98% 98% 91%   PainSc:        Isolation Precautions No active isolations  Medications Medications  magnesium sulfate IVPB 2 g 50 mL (2 g Intravenous New Bag/Given 09/27/17 0203)  sodium  chloride flush (NS) 0.9 % injection 3 mL (3 mLs Intravenous Given 09/27/17 0213)  0.9 % NaCl with KCl 20 mEq/ L  infusion (has no administration in time range)  acetaminophen (TYLENOL) tablet 650 mg (has no administration in time range)    Or  acetaminophen (TYLENOL) suppository 650 mg (has no administration in time range)  HYDROcodone-acetaminophen (NORCO/VICODIN) 5-325  MG per tablet 1-2 tablet (has no administration in time range)  senna-docusate (Senokot-S) tablet 1 tablet (has no administration in time range)  bisacodyl (DULCOLAX) EC tablet 5 mg (has no administration in time range)  LORazepam (ATIVAN) injection 2-3 mg (2 mg Intravenous Given 09/27/17 0203)  thiamine (VITAMIN B-1) tablet 100 mg (has no administration in time range)  folic acid (FOLVITE) tablet 1 mg (has no administration in time range)  multivitamin with minerals tablet 1 tablet (has no administration in time range)  sodium chloride 0.9 % bolus 1,000 mL (0 mLs Intravenous Stopped 09/27/17 0105)  potassium chloride SA (K-DUR,KLOR-CON) CR tablet 40 mEq (40 mEq Oral Given 09/27/17 0203)    Mobility walks

## 2017-09-28 NOTE — Progress Notes (Signed)
RN paged because pt wanted to leave AMA. NP to room to discuss with pt.  S: she is bored and wants real food to eat. Doesn't understand why she is still here that she "volunteered" and "learned what the nurse's wanted her to". Told RNs that she is here for a research study and needs to be in a room for that.  O: Appears well. Disoriented. Can not tell writer why she is in the hospital. BP tad high for her age. Still tachycardic. Affect abnormal as she stood up from chair, clapped, laughed and Cytogeneticisthugged writer when she entered room.  No neuro deficit noted.  A/P: 1. Seizure secondary to ETOH withdrawal. Discussed with pt the reason she is in the hospital and why she is still here. Explained that she is in withdrawal and we are giving her medicine to help and also keeping her safe and watching for seizures. Discussed risks of leaving including withdrawal symptoms, seizure or even death. Talked about why writer can not let her leave AMA. Pt seemed satisfied with this explanation and agrees to stay.  KJKG, NP [facility]

## 2017-09-28 NOTE — Progress Notes (Signed)
PROGRESS NOTE    Angela Palmer  ZOX:[id number] DOB: [birth year] DOA: 09/26/2017 PCP: Deatra JamesSun, Vyvyan, MD    Brief Narrative:  32 y.o. female with medical history significant for severe alcohol dependence with history of withdrawal seizure, now presenting to the emergency department after a seizure at home in the setting of alcohol withdrawal.    Assessment & Plan:   Principal Problem:   Alcohol dependence with withdrawal with complication (HCC) - Transfer to floor on CIWA protocol - Pt want to quit alcohol - still tremulous with tachycardia.  Active Problems:   Seizure (HCC) - No breakthrough seizures. Continue Ativan per CIWA protocol    Hypokalemia - resolved after replacement    Hypomagnesemia - resolved after replacement.   DVT prophylaxis: SCD's Code Status: Full Family Communication: None at bedside. Disposition Plan: tx to med surg   Consultants:   None   Procedures: None   Antimicrobials: n/a   Subjective: Pt has no new complaints. Asking questions regarding estimated length of stay.  Objective: Vitals:   09/28/17 0010 09/28/17 0400 09/28/17 0757 09/28/17 0800  BP: (!) 139/103 (!) 148/111 (!) 149/102   Pulse: (!) 103  (!) 101 100  Resp: (!) 22 14 19  (!) 25  Temp:  98.7 F (37.1 C)  98.5 F (36.9 C)  TempSrc:  Oral  Oral  SpO2: 98%  95%   Weight:      Height:        Intake/Output Summary (Last 24 hours) at 09/28/2017 0910 Last data filed at 09/27/2017 0916 Gross per 24 hour  Intake 123 ml  Output -  Net 123 ml   Filed Weights   09/27/17 0315  Weight: 60.1 kg (132 lb 7.9 oz)    Examination:  General exam: Appears calm and comfortable, in nad. Respiratory system: Clear to auscultation. Respiratory effort normal. Equal chest rise.  Cardiovascular system: S1 & S2 heard, RRR. No JVD, murmurs, rubs Gastrointestinal system: Abdomen is nondistended, soft and nontender. No organomegaly or masses felt. Normal bowel sounds heard. Central  nervous system: Alert and oriented. No focal neurological deficits. tremulous Extremities: Symmetric 5 x 5 power. Skin: No rashes, lesions or ulcers, on limited exam. Psychiatry: Judgement and insight appear normal. Mood & affect appropriate.    Data Reviewed: I have personally reviewed following labs and imaging studies  CBC: Recent Labs  Lab 09/26/17 2328 09/27/17 0346  WBC 5.5 6.6  NEUTROABS 4.5  --   HGB 13.8 12.7  HCT 40.9 37.9  MCV 100.2* 100.8*  PLT 159 135*   Basic Metabolic Panel: Recent Labs  Lab 09/26/17 2328 09/26/17 2343 09/27/17 0346  NA 137  --  135  K 3.4*  --  3.5  CL 93*  --  96*  CO2 23  --  25  GLUCOSE 138*  --  97  BUN 6  --  5*  CREATININE 0.57  --  0.50  CALCIUM 9.8  --  8.9  MG  --  1.5* 2.6*   GFR: Estimated Creatinine Clearance: 93.4 mL/min (by C-G formula based on SCr of 0.5 mg/dL). Liver Function Tests: Recent Labs  Lab 09/26/17 2328 09/27/17 0346  AST <5* 253*  ALT 139* 115*  ALKPHOS 84 75  BILITOT 1.4* 1.7*  PROT 8.6* 7.8  ALBUMIN 5.2* 4.7   Recent Labs  Lab 09/26/17 2328  LIPASE 62*   No results for input(s): AMMONIA in the last 168 hours. Coagulation Profile: No results for input(s): INR, PROTIME in the last  168 hours. Cardiac Enzymes: No results for input(s): CKTOTAL, CKMB, CKMBINDEX, TROPONINI in the last 168 hours. BNP (last 3 results) No results for input(s): PROBNP in the last 8760 hours. HbA1C: No results for input(s): HGBA1C in the last 72 hours. CBG: No results for input(s): GLUCAP in the last 168 hours. Lipid Profile: No results for input(s): CHOL, HDL, LDLCALC, TRIG, CHOLHDL, LDLDIRECT in the last 72 hours. Thyroid Function Tests: No results for input(s): TSH, T4TOTAL, FREET4, T3FREE, THYROIDAB in the last 72 hours. Anemia Panel: No results for input(s): VITAMINB12, FOLATE, FERRITIN, TIBC, IRON, RETICCTPCT in the last 72 hours. Sepsis Labs: No results for input(s): PROCALCITON, LATICACIDVEN in the last  168 hours.  Recent Results (from the past 240 hour(s))  MRSA PCR Screening     Status: None   Collection Time: 09/27/17  3:15 AM  Result Value Ref Range Status   MRSA by PCR NEGATIVE NEGATIVE Final    Comment:        The GeneXpert MRSA Assay (FDA approved for NASAL specimens only), is one component of a comprehensive MRSA colonization surveillance program. It is not intended to diagnose MRSA infection nor to guide or monitor treatment for MRSA infections. Performed at [facility], [address], [place], [place] [address]      Radiology Studies: No results found.  Scheduled Meds: . folic acid  1 mg Oral Daily  . multivitamin with minerals  1 tablet Oral Daily  . sodium chloride flush  3 mL Intravenous Q12H  . thiamine  100 mg Oral Daily   Continuous Infusions:   LOS: 0 days    Time spent: 35 min  Penny Pia, MD [facility] Pager [phone]  If 7PM-7AM, please contact night-coverage [email or web address] Password [email or web address] 09/28/2017, 9:10 AM

## 2017-09-29 MED ORDER — LORAZEPAM 1 MG PO TABS
2.0000 mg | ORAL_TABLET | Freq: Once | ORAL | Status: DC
  Filled 2017-09-29: qty 2

## 2017-09-29 MED ORDER — HALOPERIDOL LACTATE 5 MG/ML IJ SOLN
INTRAMUSCULAR | Status: AC
  Filled 2017-09-29: qty 1

## 2017-09-29 MED ORDER — HALOPERIDOL LACTATE 5 MG/ML IJ SOLN
2.5000 mg | Freq: Once | INTRAMUSCULAR | Status: AC
  Administered 2017-09-29: 2.5 mg via INTRAVENOUS
  Filled 2017-09-29: qty 1

## 2017-09-29 MED ORDER — HALOPERIDOL LACTATE 5 MG/ML IJ SOLN
2.5000 mg | Freq: Four times a day (QID) | INTRAMUSCULAR | Status: DC | PRN
  Administered 2017-09-29: 2.5 mg via INTRAVENOUS
  Filled 2017-09-29: qty 1

## 2017-09-29 MED ORDER — LORAZEPAM 2 MG/ML IJ SOLN
2.0000 mg | Freq: Once | INTRAMUSCULAR | Status: AC
  Administered 2017-09-29: 2 mg via INTRAMUSCULAR
  Filled 2017-09-29: qty 1

## 2017-09-29 NOTE — Progress Notes (Signed)
Patient has not had any urine output today. Patient has not had much to drink all day. Patient had 1 can of soda and a grilled cheese sandwich for lunch. An attempt to bladder scan the patient was made, however, patient refused. Per patient "No, it's okay. You don't need to do that. I'll pee later." MD made aware.  Will continue to monitor.

## 2017-09-29 NOTE — Progress Notes (Signed)
PROGRESS NOTE    Angela Palmer  RUE:[id number] DOB: 1985-12-10 DOA: 09/26/2017 PCP: Deatra James, MD    Brief Narrative:  32 y.o. female with medical history significant for severe alcohol dependence with history of withdrawal seizure, now presenting to the emergency department after a seizure at home in the setting of alcohol withdrawal.   Pt still requiring close monitoring per my discussion with nursing. She tried to leave ama and required restraints overnight. She does not remember that this morning.  Assessment & Plan:   Principal Problem:   Alcohol dependence with withdrawal with complication (HCC) - Continue current game plan CIWA protocol, continue prn ativan  Active Problems:   Seizure (HCC) - No breakthrough seizures. Continue Ativan per CIWA protocol    Hypokalemia - resolved after replacement    Hypomagnesemia - resolved after replacement.   DVT prophylaxis: SCD's Code Status: Full Family Communication: None at bedside. Disposition Plan: Pt still requires close observation due to increased risk of seizures and continued administration of ativan.   Consultants:   None   Procedures: None   Antimicrobials: n/a   Subjective: Pt wanted to leave ama yesterday then decided to stay. Required restraints overnight. Nursing reports that this am she does not remember that.  Objective: Vitals:   09/29/17 0106 09/29/17 0250 09/29/17 0933 09/29/17 0934  BP: (!) 163/95 (!) 156/85 (!) 142/93 112/67  Pulse:   98 90  Resp: (!) 23 (!) 22 15 15   Temp:   97.9 F (36.6 C)   TempSrc:   Axillary   SpO2:   97% 98%  Weight:      Height:       No intake or output data in the 24 hours ending 09/29/17 1001 Filed Weights   09/27/17 0315  Weight: 60.1 kg (132 lb 7.9 oz)    Examination:  General exam: Appears calm and comfortable, in nad. Respiratory system: Clear to auscultation. Respiratory effort normal. Equal chest rise.  Cardiovascular system: S1 & S2 heard,  RRR. No JVD, murmurs, rubs Gastrointestinal system: Abdomen is nondistended, soft and nontender. No organomegaly or masses felt. Normal bowel sounds heard. Central nervous system: No focal neurological deficits. tremulous Extremities: Symmetric 5 x 5 power. Skin: No rashes, lesions or ulcers, on limited exam. Psychiatry:  Unable to assess pt resting comfortably   Data Reviewed: I have personally reviewed following labs and imaging studies  CBC: Recent Labs  Lab 09/26/17 2328 09/27/17 0346  WBC 5.5 6.6  NEUTROABS 4.5  --   HGB 13.8 12.7  HCT 40.9 37.9  MCV 100.2* 100.8*  PLT 159 135*   Basic Metabolic Panel: Recent Labs  Lab 09/26/17 2328 09/26/17 2343 09/27/17 0346  NA 137  --  135  K 3.4*  --  3.5  CL 93*  --  96*  CO2 23  --  25  GLUCOSE 138*  --  97  BUN 6  --  5*  CREATININE 0.57  --  0.50  CALCIUM 9.8  --  8.9  MG  --  1.5* 2.6*   GFR: Estimated Creatinine Clearance: 93.4 mL/min (by C-G formula based on SCr of 0.5 mg/dL). Liver Function Tests: Recent Labs  Lab 09/26/17 2328 09/27/17 0346  AST <5* 253*  ALT 139* 115*  ALKPHOS 84 75  BILITOT 1.4* 1.7*  PROT 8.6* 7.8  ALBUMIN 5.2* 4.7   Recent Labs  Lab 09/26/17 2328  LIPASE 62*   No results for input(s): AMMONIA in the last 168 hours. Coagulation  Profile: No results for input(s): INR, PROTIME in the last 168 hours. Cardiac Enzymes: No results for input(s): CKTOTAL, CKMB, CKMBINDEX, TROPONINI in the last 168 hours. BNP (last 3 results) No results for input(s): PROBNP in the last 8760 hours. HbA1C: No results for input(s): HGBA1C in the last 72 hours. CBG: No results for input(s): GLUCAP in the last 168 hours. Lipid Profile: No results for input(s): CHOL, HDL, LDLCALC, TRIG, CHOLHDL, LDLDIRECT in the last 72 hours. Thyroid Function Tests: No results for input(s): TSH, T4TOTAL, FREET4, T3FREE, THYROIDAB in the last 72 hours. Anemia Panel: No results for input(s): VITAMINB12, FOLATE, FERRITIN,  TIBC, IRON, RETICCTPCT in the last 72 hours. Sepsis Labs: No results for input(s): PROCALCITON, LATICACIDVEN in the last 168 hours.  Recent Results (from the past 240 hour(s))  MRSA PCR Screening     Status: None   Collection Time: 09/27/17  3:15 AM  Result Value Ref Range Status   MRSA by PCR NEGATIVE NEGATIVE Final    Comment:        The GeneXpert MRSA Assay (FDA approved for NASAL specimens only), is one component of a comprehensive MRSA colonization surveillance program. It is not intended to diagnose MRSA infection nor to guide or monitor treatment for MRSA infections. Performed at [facility], [address] [place], [place] [address]      Radiology Studies: No results found.  Scheduled Meds: . folic acid  1 mg Oral Daily  . multivitamin with minerals  1 tablet Oral Daily  . sodium chloride flush  3 mL Intravenous Q12H  . thiamine  100 mg Oral Daily   Continuous Infusions:   LOS: 1 day    Time spent: 10 min  Penny Piarlando Avni Traore, MD [facility] Pager [phone]  If 7PM-7AM, please contact night-coverage [email or web address] Password [email or web address] 09/29/2017, 10:01 AM

## 2017-09-30 DIAGNOSIS — E876 Hypokalemia: Secondary | ICD-10-CM

## 2017-09-30 DIAGNOSIS — F10239 Alcohol dependence with withdrawal, unspecified: Secondary | ICD-10-CM

## 2017-09-30 DIAGNOSIS — R569 Unspecified convulsions: Principal | ICD-10-CM

## 2017-09-30 LAB — TSH: TSH: 1.726 u[IU]/mL (ref 0.350–4.500)

## 2017-09-30 LAB — HEPATIC FUNCTION PANEL
ALT: 100 U/L — AB (ref 14–54)
AST: 122 U/L — ABNORMAL HIGH (ref 15–41)
Albumin: 4.9 g/dL (ref 3.5–5.0)
Alkaline Phosphatase: 76 U/L (ref 38–126)
BILIRUBIN DIRECT: 0.2 mg/dL (ref 0.1–0.5)
BILIRUBIN INDIRECT: 0.6 mg/dL (ref 0.3–0.9)
Total Bilirubin: 0.8 mg/dL (ref 0.3–1.2)
Total Protein: 8.4 g/dL — ABNORMAL HIGH (ref 6.5–8.1)

## 2017-09-30 MED ORDER — LORAZEPAM 0.5 MG PO TABS
0.5000 mg | ORAL_TABLET | Freq: Once | ORAL | Status: AC
  Administered 2017-10-01: 0.5 mg via ORAL
  Filled 2017-09-30: qty 1

## 2017-09-30 MED ORDER — SODIUM CHLORIDE 0.9 % IV BOLUS
1000.0000 mL | Freq: Once | INTRAVENOUS | Status: AC
  Administered 2017-09-30: 1000 mL via INTRAVENOUS

## 2017-09-30 NOTE — Progress Notes (Signed)
PROGRESS NOTE    Angela Lindenshton Dawn Ducksworth  ZOX:[id number] DOB: Aug 31, 1985 DOA: 09/26/2017 PCP: Deatra JamesSun, Vyvyan, MD   Brief Narrative: Angela Palmer is a 32 y.o. female with a history of alcohol abuse presented with alcohol withdrawal and seizures. She has been treated via CIWA protocol and is improving.   Assessment & Plan:   Principal Problem:   Alcohol dependence with withdrawal with complication (HCC) Active Problems:   Seizure (HCC)   Hypokalemia   Hypomagnesemia   Alcohol withdrawal w/ seizures Patient managed in the stepdown unit.  She was monitored via the CIWA protocol.  Patient was given Ativan for significant withdrawal symptoms.  No recurrent seizure.  CIWA scores improved over the course of her admission. Still with tachycardia and hypertension -Continue CIWA  Sinus tachycardia Likely related to alcohol withdrawal.  TSH normal.  EKG obtained and confirms sinus tachycardia.  -CIWA -IV fluid bolus  Hypokalemia Hypomagnesemia Given supplementation  Elevated AST/ALT/Bilirubin In setting of alcohol intoxication. Improved.   DVT prophylaxis: Lovenox Code Status:   Code Status: Full Code Family Communication: None at bedside Disposition Plan: Discharge in 1-2 days   Consultants:   None  Procedures:   None  Antimicrobials:  None    Subjective: Patient feels fine. No issues overnight.  Objective: Vitals:   09/30/17 0815 09/30/17 0900 09/30/17 1000 09/30/17 1200  BP: (!) 152/96     Pulse: (!) 123 (!) 131 (!) 111   Resp: 19 20 (!) 22   Temp:    97.9 F (36.6 C)  TempSrc:    Oral  SpO2: 98% 100% 100%   Weight:      Height:        Intake/Output Summary (Last 24 hours) at 09/30/2017 1547 Last data filed at 09/30/2017 0600 Gross per 24 hour  Intake 310 ml  Output -  Net 310 ml   Filed Weights   09/27/17 0315  Weight: 60.1 kg (132 lb 7.9 oz)    Examination:  General exam: Appears anxious and comfortable Respiratory system: Clear to  auscultation. Respiratory effort normal. Cardiovascular system: S1 & S2 heard, tachycardia with normal rhythm. No murmurs, rubs, gallops or clicks. Gastrointestinal system: Abdomen is nondistended, soft and nontender. No organomegaly or masses felt. Normal bowel sounds heard. Central nervous system: Alert and oriented. No focal neurological deficits. Extremities: No edema. No calf tenderness Skin: No cyanosis. No rashes Psychiatry: anxious    Data Reviewed: I have personally reviewed following labs and imaging studies  CBC: Recent Labs  Lab 09/26/17 2328 09/27/17 0346  WBC 5.5 6.6  NEUTROABS 4.5  --   HGB 13.8 12.7  HCT 40.9 37.9  MCV 100.2* 100.8*  PLT 159 135*   Basic Metabolic Panel: Recent Labs  Lab 09/26/17 2328 09/26/17 2343 09/27/17 0346  NA 137  --  135  K 3.4*  --  3.5  CL 93*  --  96*  CO2 23  --  25  GLUCOSE 138*  --  97  BUN 6  --  5*  CREATININE 0.57  --  0.50  CALCIUM 9.8  --  8.9  MG  --  1.5* 2.6*   GFR: Estimated Creatinine Clearance: 93.4 mL/min (by C-G formula based on SCr of 0.5 mg/dL). Liver Function Tests: Recent Labs  Lab 09/26/17 2328 09/27/17 0346 09/30/17 0758  AST <5* 253* 122*  ALT 139* 115* 100*  ALKPHOS 84 75 76  BILITOT 1.4* 1.7* 0.8  PROT 8.6* 7.8 8.4*  ALBUMIN 5.2* 4.7 4.9  Recent Labs  Lab 09/26/17 2328  LIPASE 62*   No results for input(s): AMMONIA in the last 168 hours. Coagulation Profile: No results for input(s): INR, PROTIME in the last 168 hours. Cardiac Enzymes: No results for input(s): CKTOTAL, CKMB, CKMBINDEX, TROPONINI in the last 168 hours. BNP (last 3 results) No results for input(s): PROBNP in the last 8760 hours. HbA1C: No results for input(s): HGBA1C in the last 72 hours. CBG: No results for input(s): GLUCAP in the last 168 hours. Lipid Profile: No results for input(s): CHOL, HDL, LDLCALC, TRIG, CHOLHDL, LDLDIRECT in the last 72 hours. Thyroid Function Tests: Recent Labs    09/30/17 0758    TSH 1.726   Anemia Panel: No results for input(s): VITAMINB12, FOLATE, FERRITIN, TIBC, IRON, RETICCTPCT in the last 72 hours. Sepsis Labs: No results for input(s): PROCALCITON, LATICACIDVEN in the last 168 hours.  Recent Results (from the past 240 hour(s))  MRSA PCR Screening     Status: None   Collection Time: 09/27/17  3:15 AM  Result Value Ref Range Status   MRSA by PCR NEGATIVE NEGATIVE Final    Comment:        The GeneXpert MRSA Assay (FDA approved for NASAL specimens only), is one component of a comprehensive MRSA colonization surveillance program. It is not intended to diagnose MRSA infection nor to guide or monitor treatment for MRSA infections. Performed at [facility], [address], [place], [place] [address]          Radiology Studies: No results found.      Scheduled Meds: . folic acid  1 mg Oral Daily  . multivitamin with minerals  1 tablet Oral Daily  . sodium chloride flush  3 mL Intravenous Q12H  . thiamine  100 mg Oral Daily   Continuous Infusions:   LOS: 2 days     Jacquelin Hawking, MD [facility] 09/30/2017, 3:47 PM Pager: [phone]  If 7PM-7AM, please contact night-coverage [email or web address] 09/30/2017, 3:47 PM

## 2017-09-30 NOTE — Discharge Summary (Signed)
Physician Discharge Summary  Angela Palmer [id number]:[id number] DOB: 10-Mar-1986 DOA: 09/26/2017  PCP: Deatra James, MD  Admit date: 09/26/2017 Discharge date: 10/01/2017  Admitted From: Home Disposition: Home  Recommendations for Outpatient Follow-up:  1. Follow up with PCP in 1 week 2. Please obtain CMP/CBC in one week 3. Please follow up on the following pending results: None  Home Health: None Equipment/Devices: None  Discharge Condition: Stable CODE STATUS: Full code Diet recommendation: Regular   Brief/Interim Summary:  Admission HPI written by Briscoe Deutscher, MD   Chief Complaint: Seizure   HPI: Angela Palmer is a 32 y.o. female with medical history significant for severe alcohol dependence with history of withdrawal seizure, now presenting to the emergency department after a seizure at home in the setting of alcohol withdrawal.  Patient reports that she typically drinks 750 mL of liquor daily, but had not had any alcohol today.  She had some nausea, sweating, tremulousness, and was then observed to have generalized seizure-like activity by family.  She reports biting her tongue during this episode, but denies suffering any other injury, denies headache, denies focal numbness or weakness, and denies any recent fevers, chills, cough, shortness of breath, or chest pain.  No recent fall or trauma.  ED Course: Upon arrival to the ED, patient is found to be afebrile, saturating well on room air, slightly tachypneic, tachycardic in the 110s, and with stable blood pressure.  EKG features a sinus tachycardia with rate 103 and QTc interval 507 ms.  Chemistry panel is notable for potassium 3.4, magnesium 1.5, and ALT 139.  CBC features a mild macrocytosis without anemia.  Patient was treated with 1 L of normal saline, 40 mEq oral potassium, 2 g IV magnesium, and Ativan in the ED.  She remains slightly tachycardic and tremulous, and will be admitted for ongoing evaluation and management  of dependence with complicated withdrawal.    Hospital course:  Alcohol withdrawal w/ seizures Patient managed in the stepdown unit.  She was monitored via the CIWA protocol.  Patient was given Ativan for significant withdrawal symptoms.  No recurrent seizure.  CIWA scores improved over the course of her admission. Outpatient management for rehabilitation/counseling.  Sinus tachycardia TSH normal.  EKG obtained and confirms sinus tachycardia.  No evidence of withdrawal currently. IV fluid bolus given with some improvement. Recommend outpatient cardiology follow-up.  Hypokalemia Hypomagnesemia Given supplementation  Elevated AST/ALT/Bilirubin In setting of alcohol intoxication. Improved.  Discharge Diagnoses:  Principal Problem:   Alcohol dependence with withdrawal with complication [facility]) Active Problems:   Seizure (HCC)   Hypokalemia   Hypomagnesemia    Discharge Instructions  Discharge Instructions    Diet - low sodium heart healthy   Complete by:  As directed    Increase activity slowly   Complete by:  As directed      Allergies as of 10/01/2017      Reactions   Minocycline Swelling   Swelling of the face       Medication List    TAKE these medications   folic acid 1 MG tablet Commonly known as:  FOLVITE Take 1 tablet (1 mg total) by mouth daily. Start taking on:  10/02/2017   multivitamin with minerals Tabs tablet Take 1 tablet by mouth daily. Start taking on:  10/02/2017   thiamine 100 MG tablet Take 1 tablet (100 mg total) by mouth daily. Start taking on:  10/02/2017       Allergies  Allergen Reactions  .  Minocycline Swelling    Swelling of the face     Consultations:  None   Procedures/Studies: No results found.   Subjective: Patient reports no issues overnight. No withdrawal symptoms.  Discharge Exam: Vitals:   10/01/17 0547 10/01/17 0911  BP: (!) 149/98 (!) 134/101  Pulse: (!) 109 (!) 105  Resp: 16   Temp: 98.8 F (37.1 C)  98.4 F (36.9 C)  SpO2: 99% 100%   Vitals:   09/30/17 2352 09/30/17 2358 10/01/17 0547 10/01/17 0911  BP: (!) 146/103 (!) 146/103 (!) 149/98 (!) 134/101  Pulse: (!) 105 (!) 105 (!) 109 (!) 105  Resp:  16 16   Temp:  98.8 F (37.1 C) 98.8 F (37.1 C) 98.4 F (36.9 C)  TempSrc:   Oral Oral  SpO2:  100% 99% 100%  Weight:      Height:        General: Pt is alert, awake, not in acute distress Cardiovascular: Slightly tachycardic, regular rhythm, S1/S2 +, no rubs, no gallops Respiratory: CTA bilaterally, no wheezing, no rhonchi Abdominal: Soft, NT, ND, bowel sounds + Extremities: no edema, no cyanosis Psych: appears anxious    The results of significant diagnostics from this hospitalization (including imaging, microbiology, ancillary and laboratory) are listed below for reference.     Microbiology: Recent Results (from the past 240 hour(s))  MRSA PCR Screening     Status: None   Collection Time: 09/27/17  3:15 AM  Result Value Ref Range Status   MRSA by PCR NEGATIVE NEGATIVE Final    Comment:        The GeneXpert MRSA Assay (FDA approved for NASAL specimens only), is one component of a comprehensive MRSA colonization surveillance program. It is not intended to diagnose MRSA infection nor to guide or monitor treatment for MRSA infections. Performed at [facility], [address] [place], [place] [address]      Labs: BNP (last 3 results) No results for input(s): BNP in the last 8760 hours. Basic Metabolic Panel: Recent Labs  Lab 09/26/17 2328 09/26/17 2343 09/27/17 0346  NA 137  --  135  K 3.4*  --  3.5  CL 93*  --  96*  CO2 23  --  25  GLUCOSE 138*  --  97  BUN 6  --  5*  CREATININE 0.57  --  0.50  CALCIUM 9.8  --  8.9  MG  --  1.5* 2.6*   Liver Function Tests: Recent Labs  Lab 09/26/17 2328 09/27/17 0346 09/30/17 0758  AST <5* 253* 122*  ALT 139* 115* 100*  ALKPHOS 84 75 76  BILITOT 1.4* 1.7* 0.8  PROT 8.6* 7.8 8.4*    ALBUMIN 5.2* 4.7 4.9   Recent Labs  Lab 09/26/17 2328  LIPASE 62*   No results for input(s): AMMONIA in the last 168 hours. CBC: Recent Labs  Lab 09/26/17 2328 09/27/17 0346  WBC 5.5 6.6  NEUTROABS 4.5  --   HGB 13.8 12.7  HCT 40.9 37.9  MCV 100.2* 100.8*  PLT 159 135*   Cardiac Enzymes: No results for input(s): CKTOTAL, CKMB, CKMBINDEX, TROPONINI in the last 168 hours. BNP: Invalid input(s): POCBNP CBG: No results for input(s): GLUCAP in the last 168 hours. D-Dimer No results for input(s): DDIMER in the last 72 hours. Hgb A1c No results for input(s): HGBA1C in the last 72 hours. Lipid Profile No results for input(s): CHOL, HDL, LDLCALC, TRIG, CHOLHDL, LDLDIRECT in the last 72 hours. Thyroid function studies  Recent Labs    09/30/17 0758  TSH 1.726   Anemia work up No results for input(s): VITAMINB12, FOLATE, FERRITIN, TIBC, IRON, RETICCTPCT in the last 72 hours. Urinalysis    Component Value Date/Time   COLORURINE YELLOW 09/26/2017 2328   APPEARANCEUR CLEAR 09/26/2017 2328   LABSPEC 1.017 09/26/2017 2328   PHURINE 8.0 09/26/2017 2328   GLUCOSEU NEGATIVE 09/26/2017 2328   HGBUR SMALL (A) 09/26/2017 2328   BILIRUBINUR NEGATIVE 09/26/2017 2328   KETONESUR 80 (A) 09/26/2017 2328   PROTEINUR 100 (A) 09/26/2017 2328   NITRITE NEGATIVE 09/26/2017 2328   LEUKOCYTESUR NEGATIVE 09/26/2017 2328   Sepsis Labs Invalid input(s): PROCALCITONIN,  WBC,  LACTICIDVEN Microbiology Recent Results (from the past 240 hour(s))  MRSA PCR Screening     Status: None   Collection Time: 09/27/17  3:15 AM  Result Value Ref Range Status   MRSA by PCR NEGATIVE NEGATIVE Final    Comment:        The GeneXpert MRSA Assay (FDA approved for NASAL specimens only), is one component of a comprehensive MRSA colonization surveillance program. It is not intended to diagnose MRSA infection nor to guide or monitor treatment for MRSA infections. Performed at [facility], [address] [place], [place] [address]     SIGNED:   Jacquelin Hawkingalph Manmeet Arzola, MD [facility] 10/01/2017, 10:58 AM

## 2017-10-01 MED ORDER — FOLIC ACID 1 MG PO TABS: 1.0000 mg | ORAL_TABLET | Freq: Every day | ORAL | Status: DC

## 2017-10-01 MED ORDER — ADULT MULTIVITAMIN W/MINERALS CH: 1.0000 | ORAL_TABLET | Freq: Every day | ORAL | Status: DC

## 2017-10-01 MED ORDER — THIAMINE HCL 100 MG PO TABS: 100.0000 mg | ORAL_TABLET | Freq: Every day | ORAL | Status: DC

## 2017-10-01 NOTE — Discharge Instructions (Signed)
Alcohol Withdrawal °Alcohol withdrawal is a group of symptoms that can develop when a person who drinks heavily and regularly stops drinking or drinks less. °What are the causes? °Heavy and regular drinking can cause chemicals that send signals from the brain to the body (neurotransmitters) to deactivate. Alcohol withdrawal develops when deactivated neurotransmitters reactivate because a person stops drinking or drinks less. °What increases the risk? °The more a person drinks and the longer he or she drinks, the greater the risk of alcohol withdrawal. Severe withdrawal is more likely to develop in someone who: °· Had severe alcohol withdrawal in the past. °· Had a seizure during a previous episode of alcohol withdrawal. °· Is elderly. °· Is pregnant. °· Has been abusing drugs. °· Has other medical problems, including: °? Infection. °? Heart, lung, or liver disease. °? Seizures. °? Mental health problems. ° °What are the signs or symptoms? °Symptoms of this condition can be mild to moderate, or they can be severe. °Mild to moderate symptoms may include: °· Fatigue. °· Nightmares. °· Trouble sleeping. °· Depression. °· Anxiety. °· Inability to think clearly. °· Mood swings. °· Irritability. °· Loss of appetite. °· Nausea or vomiting. °· Clammy skin. °· Extreme sweating. °· Rapid heartbeat. °· Shakiness. °· Uncontrollable shaking (tremor). ° °Severe symptoms may include: °· Fever. °· Seizures. °· Severe confusion. °· Feeling or seeing things that are not there (hallucinations). ° °Symptoms usually begin within eight hours after a person stops drinking or drinks less. They can last for weeks. °How is this diagnosed? °Alcohol withdrawal is diagnosed with a medical history and physical exam. Sometimes, urine and blood tests are also done. °How is this treated? °Treatment may involve: °· Monitoring blood pressure, pulse, and breathing. °· Getting fluids through an IV tube. °· Medicine to reduce anxiety. °· Medicine to  prevent or control seizures. °· Multivitamins and B vitamins. °· Having a health care provider check on you daily. ° °If symptoms are moderate to severe or if there is a risk of severe withdrawal, treatment may be done at a hospital or treatment center. °Follow these instructions at home: °· Take medicines and vitamin supplements only as directed by your health care provider. °· Do not drink alcohol. °· Have someone stay with you or be available if you need help. °· Drink enough fluid to keep your urine clear or pale yellow. °· Consider joining a 12-step program or another alcohol support group. °Contact a health care provider if: °· Your symptoms get worse or do not go away. °· You cannot keep food or water in your stomach. °· You are struggling with not drinking alcohol. °· You cannot stop drinking alcohol. °Get help right away if: °· You have an irregular heartbeat. °· You have chest pain. °· You have trouble breathing. °· You have symptoms of severe withdrawal, such as: °? A fever. °? Seizures. °? Severe confusion. °? Hallucinations. °This information is not intended to replace advice given to you by your health care provider. Make sure you discuss any questions you have with your health care provider. °Document Released: 01/14/2005 Document Revised: 08/14/2015 Document Reviewed: 01/23/2014 °[employer] Interactive Patient Education © 2018 [employer] ° °

## 2017-10-01 NOTE — Clinical Social Work Note (Signed)
Clinical Social Work Assessment  Patient Details  Name: Angela Palmer Dawn Burcher MRN: [id number] Date of Birth: 02-10-1986  Date of referral:  10/01/17               Reason for consult:  Substance Use/ETOH Abuse                Permission sought to share information with:    Permission granted to share information::     Name::        Agency::     Relationship::     Contact Information:     Housing/Transportation Living arrangements for the past 2 months:  Apartment Source of Information:  Patient, Medical Team Patient Interpreter Needed:  None Criminal Activity/Legal Involvement Pertinent to Current Situation/Hospitalization:  No - Comment as needed Significant Relationships:  Merchandiser, retailCommunity Support, Friend, Other Family Members Lives with:  Self Do you feel safe going back to the place where you live?  Yes Need for family participation in patient care:  No (Coment)  Care giving concerns:  Pt admitted from home- experienced seizure related to alcohol withdrawal.   Social Worker assessment / plan:  CSW consulted to assess substance use issues- alcohol. Pt was ready for DC at time of assessment therefore interaction was brief- however pt was forthcoming, pleasant, and engaged. Pt explains she got a DWI recently and is going to a etoh use counseling program ([facility]) as a result. States she has also started going to [employer] meetings, found a group at a [employer] she "felt really welcomed by." States that she feels that current plan is sufficient to meet her goals, however she was proactive about asking CSW for further treatment options should she find she needs additional support. (Provided options for SA treatment and therapy in area) States she is new to area but is building support system- has friends/family who are encouraging of her seeking sobriety.  Employment status:  [employer] Insurance information:  Self Pay (Medicaid Pending) PT Recommendations:  Not assessed at this time Information /  Referral to community resources:     Patient/Family's Response to care:  Pt gracious    Patient/Family's Understanding of and Emotional Response to Diagnosis, Current Treatment, and Prognosis:  See above- understanding and emotionally seems well-adjusted and motivated for change. Took extensive notes about resources as CSW and pt discussed her plans  Emotional Assessment Appearance:  Appears stated age Attitude/Demeanor/Rapport:  Engaged Affect (typically observed):  Accepting, Pleasant Orientation:  Oriented to Self, Oriented to Place, Oriented to  Time, Oriented to Situation Alcohol / Substance use:  Alcohol Use Psych involvement (Current and /or in the community):  No (Comment)  Discharge Needs  Concerns to be addressed:  Substance Abuse Concerns Readmission within the last 30 days:  No Current discharge risk:  Substance Abuse Barriers to Discharge:  No Barriers Identified   Nelwyn SalisburyMeghan R Madyn Ivins, LCSW 10/01/2017, 12:29 PM [phone]

## 2017-10-15 ENCOUNTER — Other Ambulatory Visit: Payer: Self-pay

## 2017-10-15 ENCOUNTER — Encounter (HOSPITAL_COMMUNITY): Payer: Self-pay

## 2017-10-15 ENCOUNTER — Ambulatory Visit (HOSPITAL_COMMUNITY)
Admission: AD | Admit: 2017-10-15 | Discharge: 2017-10-15 | Disposition: A | Discharge status: Home / Self Care | Payer: Self-pay | Source: Intra-hospital | Attending: Psychiatry | Admitting: Psychiatry

## 2017-10-15 ENCOUNTER — Emergency Department (HOSPITAL_COMMUNITY)
Admission: EM | Admit: 2017-10-15 | Discharge: 2017-10-15 | Disposition: A | Discharge status: Home / Self Care | Payer: Self-pay | Attending: Emergency Medicine | Admitting: Emergency Medicine

## 2017-10-15 DIAGNOSIS — F10239 Alcohol dependence with withdrawal, unspecified: Secondary | ICD-10-CM | POA: Insufficient documentation

## 2017-10-15 DIAGNOSIS — R569 Unspecified convulsions: Secondary | ICD-10-CM | POA: Insufficient documentation

## 2017-10-15 DIAGNOSIS — F10939 Alcohol use, unspecified with withdrawal, unspecified: Secondary | ICD-10-CM

## 2017-10-15 HISTORY — DX: Alcohol abuse, uncomplicated: F10.10

## 2017-10-15 LAB — COMPREHENSIVE METABOLIC PANEL
ALT: 90 U/L — AB (ref 0–44)
AST: 102 U/L — AB (ref 15–41)
Albumin: 4.9 g/dL (ref 3.5–5.0)
Alkaline Phosphatase: 74 U/L (ref 38–126)
Anion gap: 19 — ABNORMAL HIGH (ref 5–15)
CHLORIDE: 100 mmol/L (ref 98–111)
CO2: 20 mmol/L — ABNORMAL LOW (ref 22–32)
CREATININE: 0.55 mg/dL (ref 0.44–1.00)
Calcium: 9.7 mg/dL (ref 8.9–10.3)
GFR calc Af Amer: >60 mL/min (ref >60)
GFR calc non Af Amer: >60 mL/min (ref >60)
Glucose, Bld: 95 mg/dL (ref 70–99)
Potassium: 4.3 mmol/L (ref 3.5–5.1)
SODIUM: 139 mmol/L (ref 135–145)
Total Bilirubin: 1.2 mg/dL (ref 0.3–1.2)
Total Protein: 8.3 g/dL — ABNORMAL HIGH (ref 6.5–8.1)

## 2017-10-15 LAB — MAGNESIUM: Magnesium: 1.5 mg/dL — ABNORMAL LOW (ref 1.7–2.4)

## 2017-10-15 LAB — CBC WITH DIFFERENTIAL/PLATELET
BASOS ABS: 0 10*3/uL (ref 0.0–0.1)
Basophils Relative: 0 %
EOS ABS: 0 10*3/uL (ref 0.0–0.7)
EOS PCT: 0 %
HCT: 42.5 % (ref 36.0–46.0)
Hemoglobin: 14.4 g/dL (ref 12.0–15.0)
LYMPHS PCT: 8 %
Lymphs Abs: 0.7 10*3/uL (ref 0.7–4.0)
MCH: 34 pg (ref 26.0–34.0)
MCHC: 33.9 g/dL (ref 30.0–36.0)
MCV: 100.5 fL — AB (ref 78.0–100.0)
Monocytes Absolute: 0.5 10*3/uL (ref 0.1–1.0)
Monocytes Relative: 6 %
NEUTROS PCT: 86 %
Neutro Abs: 7.1 10*3/uL (ref 1.7–7.7)
PLATELETS: 320 10*3/uL (ref 150–400)
RBC: 4.23 MIL/uL (ref 3.87–5.11)
RDW: 12.6 % (ref 11.5–15.5)
WBC: 8.2 10*3/uL (ref 4.0–10.5)

## 2017-10-15 LAB — I-STAT BETA HCG BLOOD, ED (MC, WL, AP ONLY): I-stat hCG, quantitative: <5 m[IU]/mL (ref <5)

## 2017-10-15 MED ORDER — LORAZEPAM 2 MG/ML IJ SOLN: 0.0000 mg | Freq: Two times a day (BID) | INTRAMUSCULAR | Status: DC

## 2017-10-15 MED ORDER — VITAMIN B-1 100 MG PO TABS
100.0000 mg | ORAL_TABLET | Freq: Once | ORAL | Status: AC
  Administered 2017-10-15: 100 mg via ORAL
  Filled 2017-10-15: qty 1

## 2017-10-15 MED ORDER — FOLIC ACID 1 MG PO TABS
1.0000 mg | ORAL_TABLET | Freq: Once | ORAL | Status: AC
  Administered 2017-10-15: 1 mg via ORAL
  Filled 2017-10-15: qty 1

## 2017-10-15 MED ORDER — LORAZEPAM 1 MG PO TABS: 0.0000 mg | ORAL_TABLET | Freq: Two times a day (BID) | ORAL | Status: DC

## 2017-10-15 MED ORDER — CHLORDIAZEPOXIDE HCL 25 MG PO CAPS: ORAL_CAPSULE | ORAL | 0 refills | Status: DC

## 2017-10-15 MED ORDER — VITAMIN B-1 100 MG PO TABS: 100.0000 mg | ORAL_TABLET | Freq: Every day | ORAL | Status: DC

## 2017-10-15 MED ORDER — SODIUM CHLORIDE 0.9 % IV BOLUS
1000.0000 mL | Freq: Once | INTRAVENOUS | Status: AC
  Administered 2017-10-15: 1000 mL via INTRAVENOUS

## 2017-10-15 MED ORDER — MAGNESIUM SULFATE IN D5W 1-5 GM/100ML-% IV SOLN
1.0000 g | Freq: Once | INTRAVENOUS | Status: AC
  Administered 2017-10-15: 1 g via INTRAVENOUS
  Filled 2017-10-15: qty 100

## 2017-10-15 MED ORDER — LORAZEPAM 1 MG PO TABS
0.0000 mg | ORAL_TABLET | Freq: Four times a day (QID) | ORAL | Status: DC
  Administered 2017-10-15: 2 mg via ORAL
  Filled 2017-10-15: qty 2

## 2017-10-15 MED ORDER — LORAZEPAM 2 MG/ML IJ SOLN: 0.0000 mg | Freq: Four times a day (QID) | INTRAMUSCULAR | Status: DC

## 2017-10-15 MED ORDER — SODIUM CHLORIDE 0.9 % IV SOLN: INTRAVENOUS | Status: DC

## 2017-10-15 MED ORDER — THIAMINE HCL 100 MG/ML IJ SOLN: 100.0000 mg | Freq: Every day | INTRAMUSCULAR | Status: DC

## 2017-10-15 MED ORDER — LORAZEPAM 2 MG/ML IJ SOLN: 1.0000 mg | Freq: Once | INTRAMUSCULAR | Status: DC

## 2017-10-15 MED ORDER — ADULT MULTIVITAMIN W/MINERALS CH
1.0000 | ORAL_TABLET | Freq: Once | ORAL | Status: AC
  Administered 2017-10-15: 1 via ORAL
  Filled 2017-10-15: qty 1

## 2017-10-15 NOTE — Patient Outreach (Signed)
ED Peer Support Specialist Patient Intake (Complete at intake & 30-60 Day Follow-up)  Name: Angela Palmer  MRN: [id number]  Age: 32 y.o.   Date of Admission: 10/15/2017  Intake: Initial Comments:      Primary Reason Admitted: female with medical history significant for severe alcohol dependence with history of withdrawal seizure, now presenting to the emergency department after a seizure at home in the setting of alcohol withdrawal.  Patient reports that she typically drinks 750 mL of liquor daily, but had not had any alcohol today.  She had some nausea, sweating, tremulousness, and was then observed to have generalized seizure-like activity by family.  She reports biting her tongue during this episode, but denies suffering any other injury, denies headache, denies focal numbness or weakness, and denies any recent fevers, chills, cough, shortness of breath, or chest pain.  No recent fall or trauma.     Lab values: Alcohol/ETOH: Positive Positive UDS? No Amphetamines: No Barbiturates:   Benzodiazepines: No Cocaine: No Opiates: No Cannabinoids: No  Demographic information: Gender: Female Ethnicity: White Marital Status: Other (comment) Insurance Status: Uninsured/Self-pay Ecologist (Work Neurosurgeon, Physicist, medical, etc.: No Lives with: Partner/Spouse Living situation: House/Apartment  Reported Patient History: Patient reported health conditions: None Patient aware of HIV and hepatitis status: No  In past year, has patient visited ED for any reason? No  Number of ED visits:    Reason(s) for visit:    In past year, has patient been hospitalized for any reason? No  Number of hospitalizations:    Reason(s) for hospitalization:    In past year, has patient been arrested? Yes  Number of arrests:    Reason(s) for arrest: DWI  In past year, has patient been incarcerated? No  Number of incarcerations:    Reason(s) for incarceration:     In past year, has patient received medication-assisted treatment? No  In past year, patient received the following treatments: Other (comment)  In past year, has patient received any harm reduction services? No  Did this include any of the following?    In past year, has patient received care from a mental health provider for diagnosis other than SUD? No  In past year, is this first time patient has overdosed? No  Number of past overdoses:    In past year, is this first time patient has been hospitalized for an overdose? No  Number of hospitalizations for overdose(s):    Is patient currently receiving treatment for a mental health diagnosis? No  Patient reports experiencing difficulty participating in SUD treatment: No    Most important reason(s) for this difficulty?    Has patient received prior services for treatment? No  In past, patient has received services from following agencies:    Plan of Care:  Suggested follow up at these agencies/treatment centers: Other (comment)([facility])  Other information: CPSS met with Pt an wsa able to complete series of questions CPSS talked with Pt and was able to gain information from Pt to find out what services Pt is seeking at the time. CPSS discussed a few options that maybe beneficial for Pt. CPSS discussed a few options to see what Pt wants to look into. CPSS was able to contact [facility] to see if Pt would want to utilize there services. CPSS made Pt aware that [facility] will be able to review her information but will not have a bed available first thing in the morning. CPSS left contact information for Pt to follow up in the  AM to see if bed is available.      Aaron Edelman Denecia Brunette, [place]  10/15/2017 3:54 PM

## 2017-10-15 NOTE — ED Triage Notes (Addendum)
Patient is requesting detox from alcohol. Patient states her last drink was yesterday AM in which she had 2 shots of Vodka. Patient also reports that she had a witnessed seizure at 0830 today at home.  Patient denies any SI/HI, visual or auditory hallucinations. patient reports that she has constant nausea

## 2017-10-15 NOTE — ED Notes (Signed)
Bed: [facility]4 Expected date:  Expected time:  Means of arrival:  Comments: 

## 2017-10-15 NOTE — ED Provider Notes (Signed)
[facility]-EMERGENCY DEPT Provider Note   CSN: [id number] Arrival date & time: 10/15/17  1252     History   Chief Complaint Chief Complaint  Patient presents with  . detox  . Seizures  . Nausea    HPI Angela Palmer is a 32 y.o. female.  HPI Patient presents to the emergency room for evaluation of alcohol withdrawal symptoms.  Patient has a history of alcohol abuse.  She states she drinks 1/5 of vodka every 2 days.  Patient was recently in the hospital on June 10 for alcohol withdrawal seizures.  Patient states she was trying to remain sober but unfortunately shortly after being discharged she relapsed and started drinking again.  She tried to cut back drinking on her own.  She had a small drink of alcohol 2 days ago but did not have anything this morning.  Patient had a seizure this morning.  This was witnessed at home.  She did bite her tongue.  Patient does feel tremulous today.  She went to behavioral health hospital because she wants to get into a detox program.  They suggested she come to the emergency room.   Past Medical History:  Diagnosis Date  . Alcohol abuse   . Seizures [facility])     Patient Active Problem List   Diagnosis Date Noted  . Hypomagnesemia 09/27/2017  . Seizure (HCC) 05/07/2016  . Alcohol dependence with withdrawal with complication (HCC) 05/07/2016  . Hypokalemia 05/07/2016  . Transaminitis 05/07/2016  . Metabolic acidosis 05/07/2016    History reviewed. No pertinent surgical history.   OB History   None      Home Medications    Prior to Admission medications   Medication Sig Start Date End Date Taking? Authorizing Provider  chlordiazePOXIDE (LIBRIUM) 25 MG capsule 50mg  PO TID x 1D, then 25-50mg  PO BID X 1D, then 25-50mg  PO QD X 1D 10/15/17   Linwood Dibbles, MD  folic acid (FOLVITE) 1 MG tablet Take 1 tablet (1 mg total) by mouth daily. 10/02/17   Narda Bonds, MD  Multiple Vitamin (MULTIVITAMIN WITH MINERALS) TABS tablet  Take 1 tablet by mouth daily. 10/02/17   Narda Bonds, MD  thiamine 100 MG tablet Take 1 tablet (100 mg total) by mouth daily. 10/02/17   Narda Bonds, MD    Family History Family History  Problem Relation Age of Onset  . Hypertension Mother     Social History Social History   Tobacco Use  . Smoking status: Never Smoker  . Smokeless tobacco: Never Used  Substance Use Topics  . Alcohol use: Yes    Alcohol/week: 0.0 oz    Comment: daily use-1/2-3/4 fifth of vodka   . Drug use: No     Allergies   Minocycline   Review of Systems Review of Systems  Psychiatric/Behavioral: Negative for self-injury and suicidal ideas.  All other systems reviewed and are negative.    Physical Exam Updated Vital Signs BP (!) 128/96 (BP Location: Left Arm)   Pulse 89   Temp 98.4 F (36.9 C) (Oral)   Resp 20   Ht 1.651 m (5\' 5" )   Wt 59 kg (130 lb)   LMP 09/14/2017 (Approximate)   SpO2 100%   BMI 21.63 kg/m   Physical Exam  Constitutional: She appears well-developed and well-nourished. No distress.  HENT:  Head: Normocephalic and atraumatic.  Right Ear: External ear normal.  Left Ear: External ear normal.  Eyes: Conjunctivae are normal. Right eye exhibits  no discharge. Left eye exhibits no discharge. No scleral icterus.  Neck: Neck supple. No tracheal deviation present.  Cardiovascular: Regular rhythm and intact distal pulses. Tachycardia present.  Pulmonary/Chest: Effort normal and breath sounds normal. No stridor. No respiratory distress. She has no wheezes. She has no rales.  Abdominal: Soft. Bowel sounds are normal. She exhibits no distension. There is no tenderness. There is no rebound and no guarding.  Musculoskeletal: She exhibits no edema or tenderness.  Neurological: She is alert. She has normal strength. She displays tremor. No cranial nerve deficit (no facial droop, extraocular movements intact, no slurred speech) or sensory deficit. She exhibits normal muscle tone.  She displays no seizure activity. Coordination normal.  Skin: Skin is warm and dry. No rash noted.  Psychiatric: She has a normal mood and affect.  Nursing note and vitals reviewed.    ED Treatments / Results  Labs (all labs ordered are listed, but only abnormal results are displayed) Labs Reviewed  CBC WITH DIFFERENTIAL/PLATELET - Abnormal; Notable for the following components:      Result Value   MCV 100.5 (*)    All other components within normal limits  COMPREHENSIVE METABOLIC PANEL - Abnormal; Notable for the following components:   CO2 20 (*)    BUN <5 (*)    Total Protein 8.3 (*)    AST 102 (*)    ALT 90 (*)    Anion gap 19 (*)    All other components within normal limits  MAGNESIUM - Abnormal; Notable for the following components:   Magnesium 1.5 (*)    All other components within normal limits  I-STAT BETA HCG BLOOD, ED ([facility], [facility], [facility] ONLY)    EKG None  Radiology No results found.  Procedures Procedures (including critical care time)  Medications Ordered in ED Medications  0.9 %  sodium chloride infusion (has no administration in time range)  LORazepam (ATIVAN) injection 1 mg (has no administration in time range)  LORazepam (ATIVAN) injection 0-4 mg ( Intravenous See Alternative 10/15/17 1542)    Or  LORazepam (ATIVAN) tablet 0-4 mg (2 mg Oral Given 10/15/17 1542)  LORazepam (ATIVAN) injection 0-4 mg (has no administration in time range)    Or  LORazepam (ATIVAN) tablet 0-4 mg (has no administration in time range)  thiamine (VITAMIN B-1) tablet 100 mg (has no administration in time range)    Or  thiamine (B-1) injection 100 mg (has no administration in time range)  magnesium sulfate IVPB 1 g 100 mL (1 g Intravenous New Bag/Given 10/15/17 1635)  sodium chloride 0.9 % bolus 1,000 mL (1,000 mLs Intravenous New Bag/Given 10/15/17 1635)  thiamine (VITAMIN B-1) tablet 100 mg (100 mg Oral Given 10/15/17 1559)  multivitamin with minerals tablet 1 tablet (1 tablet Oral  Given 10/15/17 1542)  folic acid (FOLVITE) tablet 1 mg (1 mg Oral Given 10/15/17 1542)     Initial Impression / Assessment and Plan / ED Course  I have reviewed the triage vital signs and the nursing notes.  Pertinent labs & imaging results that were available during my care of the patient were reviewed by me and considered in my medical decision making (see chart for details).  Clinical Course as of Oct 15 1637  Fri Oct 15, 2017  1604 Pt appears comfortable.  Tolerating PO.  Tremor decreased.     [JK]  1605 LFT similar to previous  Comprehensive metabolic panel(!) [JK]  1605 decreased  Magnesium(!) [JK]    Clinical Course User Index [  JK] Linwood Dibbles, MD    Patient presented to the emergency room with complaints of alcohol withdrawal symptoms.  Patient responded well to benzodiazepines.  Heart rate is normalized.  She is not hypertensive.  Patient's magnesium level was replaced.  Plan on discharge home with prescription for Librium.  Peers support was able to assist the patient while she was here.  She has plans for an outpatient treatment center.  Final Clinical Impressions(s) / ED Diagnoses   Final diagnoses:  Alcohol withdrawal syndrome with complication (HCC)  Hypomagnesemia    ED Discharge Orders        Ordered    chlordiazePOXIDE (LIBRIUM) 25 MG capsule     10/15/17 1612       Linwood Dibbles, MD 10/15/17 1639

## 2017-10-15 NOTE — Discharge Instructions (Addendum)
Follow up with the resources give to you by the peer support specialist

## 2018-08-03 ENCOUNTER — Inpatient Hospital Stay (HOSPITAL_COMMUNITY): Admit: 2018-08-03 | Payer: Self-pay

## 2018-09-02 ENCOUNTER — Other Ambulatory Visit: Payer: Self-pay

## 2018-09-02 ENCOUNTER — Encounter (HOSPITAL_COMMUNITY): Payer: Self-pay | Admitting: *Deleted

## 2018-09-02 ENCOUNTER — Emergency Department (HOSPITAL_COMMUNITY)
Admission: EM | Admit: 2018-09-02 | Discharge: 2018-09-02 | Disposition: A | Discharge status: Home / Self Care | Payer: Self-pay | Attending: Emergency Medicine | Admitting: Emergency Medicine

## 2018-09-02 DIAGNOSIS — S0181XA Laceration without foreign body of other part of head, initial encounter: Secondary | ICD-10-CM

## 2018-09-02 DIAGNOSIS — Y92000 Kitchen of unspecified non-institutional (private) residence as  the place of occurrence of the external cause: Secondary | ICD-10-CM | POA: Insufficient documentation

## 2018-09-02 DIAGNOSIS — S01112A Laceration without foreign body of left eyelid and periocular area, initial encounter: Secondary | ICD-10-CM | POA: Insufficient documentation

## 2018-09-02 DIAGNOSIS — Y999 Unspecified external cause status: Secondary | ICD-10-CM | POA: Insufficient documentation

## 2018-09-02 DIAGNOSIS — W0110XA Fall on same level from slipping, tripping and stumbling with subsequent striking against unspecified object, initial encounter: Secondary | ICD-10-CM | POA: Insufficient documentation

## 2018-09-02 DIAGNOSIS — R569 Unspecified convulsions: Secondary | ICD-10-CM | POA: Insufficient documentation

## 2018-09-02 DIAGNOSIS — Y939 Activity, unspecified: Secondary | ICD-10-CM | POA: Insufficient documentation

## 2018-09-02 MED ORDER — BACITRACIN ZINC 500 UNIT/GM EX OINT
TOPICAL_OINTMENT | Freq: Two times a day (BID) | CUTANEOUS | Status: DC
  Administered 2018-09-02: 21:00:00 via TOPICAL

## 2018-09-02 MED ORDER — LIDOCAINE HCL (PF) 1 % IJ SOLN
5.0000 mL | Freq: Once | INTRAMUSCULAR | Status: AC
  Administered 2018-09-02: 5 mL via INTRADERMAL
  Filled 2018-09-02: qty 5

## 2018-09-02 NOTE — ED Provider Notes (Signed)
[facility] EMERGENCY DEPARTMENT Provider Note   CSN: [id number] Arrival date & time: 09/02/18  2000    History   Chief Complaint Chief Complaint  Patient presents with  . Facial Laceration    HPI Angela Palmer is a 33 y.o. female.     33 y.o female with a PMH of Seziures, Alcohol abuse presents to the ED with a chief complaint of left eyebrow laceration x 14 hours ago. Patient reports she was in the kitchen walking when she tripped and fell striking her left eyebrow on the way down. She reports applying pressure to the area which kept the bleeding controlled. Patient was advised by roomates to seek care in the emergency department due to wound bleeding. She has  Not taken any medication for relieve in symptoms. She denies any headache, vomiting, changes in vision.      Past Medical History:  Diagnosis Date  . Alcohol abuse   . Seizures [facility])     Patient Active Problem List   Diagnosis Date Noted  . Hypomagnesemia 09/27/2017  . Seizure (HCC) 05/07/2016  . Alcohol dependence with withdrawal with complication (HCC) 05/07/2016  . Hypokalemia 05/07/2016  . Transaminitis 05/07/2016  . Metabolic acidosis 05/07/2016    History reviewed. No pertinent surgical history.   OB History   No obstetric history on file.      Home Medications    Prior to Admission medications   Medication Sig Start Date End Date Taking? Authorizing Provider  chlordiazePOXIDE (LIBRIUM) 25 MG capsule 50mg  PO TID x 1D, then 25-50mg  PO BID X 1D, then 25-50mg  PO QD X 1D 10/15/17   Linwood Dibbles, MD  folic acid (FOLVITE) 1 MG tablet Take 1 tablet (1 mg total) by mouth daily. 10/02/17   Narda Bonds, MD  Multiple Vitamin (MULTIVITAMIN WITH MINERALS) TABS tablet Take 1 tablet by mouth daily. 10/02/17   Narda Bonds, MD  thiamine 100 MG tablet Take 1 tablet (100 mg total) by mouth daily. 10/02/17   Narda Bonds, MD    Family History Family History  Problem Relation Age of  Onset  . Hypertension Mother     Social History Social History   Tobacco Use  . Smoking status: Never Smoker  . Smokeless tobacco: Never Used  Substance Use Topics  . Alcohol use: Yes    Alcohol/week: 0.0 standard drinks    Comment: daily use-1/2-3/4 fifth of vodka   . Drug use: No     Allergies   Minocycline   Review of Systems Review of Systems  Constitutional: Negative for fever.  Skin: Positive for wound.  Neurological: Negative for syncope, weakness, light-headedness, numbness and headaches.     Physical Exam Updated Vital Signs BP (!) 128/93 (BP Location: Right Arm)   Pulse (!) 101   Temp 98.1 F (36.7 C) (Oral)   Resp 20   SpO2 98%   Physical Exam Vitals signs and nursing note reviewed.  Constitutional:      General: She is not in acute distress.    Appearance: She is well-developed.  HENT:     Head: Normocephalic and atraumatic.     Mouth/Throat:     Pharynx: No oropharyngeal exudate.  Eyes:     Pupils: Pupils are equal, round, and reactive to light.  Neck:     Musculoskeletal: Normal range of motion.  Cardiovascular:     Rate and Rhythm: Regular rhythm.     Heart sounds: Normal heart sounds.  Pulmonary:  Effort: Pulmonary effort is normal. No respiratory distress.     Breath sounds: Normal breath sounds.  Abdominal:     General: Bowel sounds are normal. There is no distension.     Palpations: Abdomen is soft.     Tenderness: There is no abdominal tenderness.  Musculoskeletal:        General: No tenderness or deformity.     Right lower leg: No edema.     Left lower leg: No edema.  Skin:    General: Skin is warm and dry.  Neurological:     Mental Status: She is alert and oriented to person, place, and time.     Comments: Alert, oriented, thought content appropriate. Speech fluent without evidence of aphasia. Able to follow 2 step commands without difficulty.  Cranial Nerves:  II:  Peripheral visual fields grossly normal, pupils, round,  reactive to light III,IV, VI: ptosis not present, extra-ocular motions intact bilaterally  V,VII: smile symmetric, facial light touch sensation equal VIII: hearing grossly normal bilaterally  IX,X: midline uvula rise  XI: bilateral shoulder shrug equal and strong XII: midline tongue extension  Motor:  5/5 in upper and lower extremities bilaterally including strong and equal grip strength and dorsiflexion/plantar flexion Sensory: light touch normal in all extremities.  Cerebellar: normal finger-to-nose with bilateral upper extremities, pronator drift negative Gait: normal gait and balance       ED Treatments / Results  Labs (all labs ordered are listed, but only abnormal results are displayed) Labs Reviewed - No data to display  EKG None  Radiology No results found.  Procedures .Marland Kitchen.Laceration Repair Date/Time: 09/02/2018 8:50 PM Performed by: Claude MangesSoto, Shana Zavaleta, PA-C Authorized by: Claude MangesSoto, Shastina Rua, PA-C   Consent:    Consent obtained:  Verbal   Consent given by:  Patient   Risks discussed:  Infection, need for additional repair, pain, poor cosmetic result and poor wound healing   Alternatives discussed:  No treatment and delayed treatment Universal protocol:    Procedure explained and questions answered to patient or proxy's satisfaction: yes     Relevant documents present and verified: yes     Test results available and properly labeled: yes     Imaging studies available: yes     Required blood products, implants, devices, and special equipment available: yes     Site/side marked: yes     Immediately prior to procedure, a time out was called: yes     Patient identity confirmed:  Verbally with patient Anesthesia (see MAR for exact dosages):    Anesthesia method:  Local infiltration   Local anesthetic:  Lidocaine 1% w/o epi Laceration details:    Location:  Face   Face location:  L eyebrow   Length (cm):  2   Depth (mm):  0.5 Repair type:    Repair type:  Simple  Exploration:    Hemostasis achieved with:  Direct pressure Treatment:    Area cleansed with:  Saline   Amount of cleaning:  Extensive   Irrigation method:  Pressure wash Skin repair:    Repair method:  Sutures   Suture size:  5-0   Suture technique:  Simple interrupted   Number of sutures:  3 Approximation:    Approximation:  Close Post-procedure details:    Dressing:  Antibiotic ointment   Patient tolerance of procedure:  Tolerated well, no immediate complications   (including critical care time)  Medications Ordered in ED Medications  lidocaine (PF) (XYLOCAINE) 1 % injection 5 mL (has no administration  in time range)  bacitracin ointment (has no administration in time range)     Initial Impression / Assessment and Plan / ED Course  I have reviewed the triage vital signs and the nursing notes.  Pertinent labs & imaging results that were available during my care of the patient were reviewed by me and considered in my medical decision making (see chart for details).  Patient with a past medical history of seizures, presents to the ED status post fall while at home on her kitchen floor this morning.  Reports a laceration above her left eyebrow, bleeding has been controlled with pressure.  She denies any headache, weakness, changes in vision or pain.  Patient's exam is unremarkable, she is neurologically intact.  Stable vital signs aside from a slight elevated heart rate.  I have personally placed 3 sutures to her left eyebrow these are to be removed within 5 to 7 days.  Patient was explained she could return here, urgent care, her PCPs office.  Patient understands and agrees to management.  CT [place] rule no imaging at this time, shared decision making conversation with patient who knows to return if she experiences any changes in vision, headache, vomiting.  Patient is stable for discharge.  Return precautions provided at length.  Portions of this note were generated with Administrator, sports. Dictation errors may occur despite best attempts at proofreading.     Final Clinical Impressions(s) / ED Diagnoses   Final diagnoses:  Facial laceration, initial encounter    ED Discharge Orders    None       Freddy Jaksch 09/02/18 2051    Azalia Bilis, MD 09/03/18 Rich Fuchs

## 2018-09-02 NOTE — ED Triage Notes (Signed)
Pt reports that she "tripped over several things and fell in the kitchen" this morning around 5:30 am and struck her head on a bucket. Lac to the left eye brow, no visual difficulties

## 2018-09-02 NOTE — Discharge Instructions (Addendum)
I have placed 3 sutures to your left eyebrow, please have these removed within 5 days.  If you experience any drainage from the wound, bleeding, headache, vomiting please return to the emergency department.

## 2018-09-02 NOTE — ED Notes (Signed)
Wound dressed with bacitracin and bandage with tape; pt ambulatory from room with steady gait, needed phone to call for ride, will wait in lobby

## 2018-09-09 ENCOUNTER — Encounter (HOSPITAL_COMMUNITY): Payer: Self-pay | Admitting: *Deleted

## 2018-09-09 ENCOUNTER — Other Ambulatory Visit: Payer: Self-pay

## 2018-09-09 ENCOUNTER — Emergency Department (HOSPITAL_COMMUNITY)
Admission: EM | Admit: 2018-09-09 | Discharge: 2018-09-09 | Disposition: A | Discharge status: Home / Self Care | Payer: Self-pay | Attending: Emergency Medicine | Admitting: Emergency Medicine

## 2018-09-09 DIAGNOSIS — R000 Tachycardia, unspecified: Secondary | ICD-10-CM | POA: Insufficient documentation

## 2018-09-09 DIAGNOSIS — R030 Elevated blood-pressure reading, without diagnosis of hypertension: Secondary | ICD-10-CM | POA: Insufficient documentation

## 2018-09-09 DIAGNOSIS — Z4802 Encounter for removal of sutures: Secondary | ICD-10-CM

## 2018-09-09 NOTE — ED Notes (Signed)
Pt does not want her withdrawal symptoms treated.  She just wants sutures to be removed.

## 2018-09-09 NOTE — ED Provider Notes (Signed)
[facility] EMERGENCY DEPARTMENT Provider Note   CSN: [id number] Arrival date & time: 09/09/18  1746    History   Chief Complaint Chief Complaint  Patient presents with  . Suture / Staple Removal    HPI Angela Palmer is a 33 y.o. female.     HPI  Patient is a 33 year old female with a past medical history of alcohol abuse and alcohol withdrawal seizures presenting for suture removal of laceration of the left upper eyebrow.  Patient reports that this was performed on 09/02/2018.  She reports that the area is been irritated but otherwise healing well.  She denies any drainage from the wound.  Patient reports that her heart rate is always elevated and she does not feel significantly anxious, diaphoretic, or fevers today.  She denies any anxiety, abdominal pain, nausea, vomiting, headache, chest pain or shortness of breath.  She has no other complaints today.  She declines staying any further beyond getting the sutures out.  Past Medical History:  Diagnosis Date  . Alcohol abuse   . Seizures (HCC)    alcohol withdrawal seizures    Patient Active Problem List   Diagnosis Date Noted  . Hypomagnesemia 09/27/2017  . Seizure (HCC) 05/07/2016  . Alcohol dependence with withdrawal with complication (HCC) 05/07/2016  . Hypokalemia 05/07/2016  . Transaminitis 05/07/2016  . Metabolic acidosis 05/07/2016    No past surgical history on file.   OB History   No obstetric history on file.      Home Medications    Prior to Admission medications   Medication Sig Start Date End Date Taking? Authorizing Provider  chlordiazePOXIDE (LIBRIUM) 25 MG capsule  PO TID x 1D, then 25-50mg  PO BID X 1D, then 25-50mg  PO QD X 1D 10/15/17   Linwood Dibbles, MD  folic acid (FOLVITE) 1 MG tablet Take 1 tablet (1 mg total) by mouth daily. 10/02/17   Narda Bonds, MD  Multiple Vitamin (MULTIVITAMIN WITH MINERALS) TABS tablet Take 1 tablet by mouth daily. 10/02/17   Narda Bonds,  MD  thiamine 100 MG tablet Take 1 tablet (100 mg total) by mouth daily. 10/02/17   Narda Bonds, MD    Family History Family History  Problem Relation Age of Onset  . Hypertension Mother     Social History Social History   Tobacco Use  . Smoking status: Never Smoker  . Smokeless tobacco: Never Used  Substance Use Topics  . Alcohol use: Yes    Alcohol/week: 0.0 standard drinks    Comment: daily use-1/2-3/4 fifth of vodka   . Drug use: No     Allergies   Minocycline   Review of Systems Review of Systems  Eyes: Negative for visual disturbance.  Skin: Positive for wound. Negative for color change.     Physical Exam Updated Vital Signs BP (!) 155/114   Pulse (!) 126   Temp 98.9 F (37.2 C)   Resp 16   Ht  (1.651 m)   Wt 56.7 kg   LMP 08/10/2018   SpO2 97%   BMI 20.80 kg/m   Physical Exam Vitals signs and nursing note reviewed.  Constitutional:      General: She is not in acute distress.    Appearance: She is well-developed. She is not diaphoretic.     Comments: Sitting comfortably in bed.  HENT:     Head: Normocephalic and atraumatic.     Comments: Patient has a well-healed laceration scar about  the left eyebrow.  No signs of dehiscence.  No drainage.  No erythema. Eyes:     General:        Right eye: No discharge.        Left eye: No discharge.     Conjunctiva/sclera: Conjunctivae normal.     Comments: EOMs normal to gross examination.  Neck:     Musculoskeletal: Normal range of motion.  Cardiovascular:     Rate and Rhythm: Regular rhythm. Tachycardia present.     Comments: Intact, 2+ radial pulse. Abdominal:     General: There is no distension.  Musculoskeletal: Normal range of motion.  Skin:    General: Skin is warm and dry.  Neurological:     Mental Status: She is alert.     Comments: Cranial nerves intact to gross observation. Patient moves extremities without difficulty.  Psychiatric:        Behavior: Behavior normal.         Thought Content: Thought content normal.        Judgment: Judgment normal.      ED Treatments / Results  Labs (all labs ordered are listed, but only abnormal results are displayed) Labs Reviewed - No data to display  EKG None  Radiology No results found.  Procedures .Suture Removal Date/Time: 09/10/2018 12:34 AM Performed by: Elisha PonderMurray, Dore Oquin B, PA-C Authorized by: Elisha PonderMurray, Hayley Horn B, PA-C   Consent:    Consent obtained:  Verbal   Consent given by:  Patient   Risks discussed:  Bleeding and wound separation Location:    Location:  Head/neck   Head/neck location:  Eyebrow   Eyebrow location:  L eyebrow Procedure details:    Wound appearance:  No signs of infection, nonpurulent, good wound healing and clean   Number of sutures removed:  3 Post-procedure details:    Patient tolerance of procedure:  Tolerated well, no immediate complications   (including critical care time)  Medications Ordered in ED Medications - No data to display   Initial Impression / Assessment and Plan / ED Course  I have reviewed the triage vital signs and the nursing notes.  Pertinent labs & imaging results that were available during my care of the patient were reviewed by me and considered in my medical decision making (see chart for details).  Clinical Course as of Sep 08 1936  Fri Sep 09, 2018  [id number] Patient declined any further work-up today.  She was notified that her blood pressure and her heart rate are elevated.  Last drink per patient was earlier today.  She reports she does not feel she is in distress.  She would like to go home after sutures are removed.  I discussed risks and benefits of this with the patient.  She is in full understanding.   [AM]    Clinical Course User Index [AM] Elisha PonderMurray, Shaquanta Harkless B, PA-C       This is a [age]-year-old female past medical history of alcohol abuse, alcohol withdrawal seizures presenting for suture removal left eyebrow.  Sutures are intact  healed and they are removed without complication.  She is hypertensive and tachycardic.  She appears nontoxic and in no acute distress. She reports her last drink was earlier today.  She denies feeling anxious, diaphoretic, headache or otherwise feeling that she is withdrawing.  Patient reports she "always has high heart rate".  HR 101 documented on prior visits. Offered to patient further evaluation and recommended that she be assessed further but patient declined.  She  reports she only wanted her sutures removed and to go.  She is not clinically intoxicated and clinically has capacity to make this decision.  She was encouraged to return should she experience any symptoms of fever, chills, chest pain, shortness of breath, diaphoresis or tremulousness.  Patient is in understanding and agrees with the plan of care.  Final Clinical Impressions(s) / ED Diagnoses   Final diagnoses:  Visit for suture removal  Elevated blood pressure reading  Tachycardia    ED Discharge Orders    None       Delia Chimes 09/10/18 0037    Mancel Bale, MD 09/10/18 [phone]

## 2018-09-09 NOTE — ED Triage Notes (Signed)
PT is here to have sutures removed from L eyebrow.

## 2018-09-09 NOTE — Discharge Instructions (Signed)
We removed your sutures today.  The wound appears well-healed.  I recommend wearing sunscreen if you will be outside. You can also apply antibiotic ointment or Mederma to reduce scarring.  Please continue to follow-up with a primary care provider about your blood pressure.  There are some resources below, including primary care for patients who do not have insurance.  To find a primary care or specialty doctor please call [phone] or [phone] to access "[facility] Find a Doctor Service."  You may also go on the [facility] website at [email or web address]  There are also multiple [facility], [facility] and [facility] practices throughout the [place] that are frequently accepting new patients. You may find a clinic that is close to your home and contact them.  [facility] - [address] [place] [phone]  [facility] in [place] (also locations in [place] and [place]) - [address] [employer]  [phone]  [facility] - [address] [place] [phone]

## 2019-03-27 ENCOUNTER — Encounter (HOSPITAL_COMMUNITY): Payer: Self-pay | Admitting: Obstetrics and Gynecology

## 2019-03-27 ENCOUNTER — Other Ambulatory Visit: Payer: Self-pay

## 2019-03-27 ENCOUNTER — Emergency Department (HOSPITAL_COMMUNITY)
Admission: EM | Admit: 2019-03-27 | Discharge: 2019-03-27 | Disposition: A | Discharge status: Home / Self Care | Payer: Self-pay | Attending: Emergency Medicine | Admitting: Emergency Medicine

## 2019-03-27 DIAGNOSIS — S01512A Laceration without foreign body of oral cavity, initial encounter: Secondary | ICD-10-CM | POA: Insufficient documentation

## 2019-03-27 DIAGNOSIS — E876 Hypokalemia: Secondary | ICD-10-CM

## 2019-03-27 DIAGNOSIS — Y998 Other external cause status: Secondary | ICD-10-CM | POA: Insufficient documentation

## 2019-03-27 DIAGNOSIS — R569 Unspecified convulsions: Secondary | ICD-10-CM | POA: Insufficient documentation

## 2019-03-27 DIAGNOSIS — Y9289 Other specified places as the place of occurrence of the external cause: Secondary | ICD-10-CM | POA: Insufficient documentation

## 2019-03-27 DIAGNOSIS — F1020 Alcohol dependence, uncomplicated: Secondary | ICD-10-CM | POA: Insufficient documentation

## 2019-03-27 DIAGNOSIS — X58XXXA Exposure to other specified factors, initial encounter: Secondary | ICD-10-CM | POA: Insufficient documentation

## 2019-03-27 DIAGNOSIS — Y9389 Activity, other specified: Secondary | ICD-10-CM | POA: Insufficient documentation

## 2019-03-27 LAB — CBC WITH DIFFERENTIAL/PLATELET
Abs Immature Granulocytes: 0.04 10*3/uL (ref 0.00–0.07)
Basophils Absolute: 0.1 10*3/uL (ref 0.0–0.1)
Basophils Relative: 1 %
Eosinophils Absolute: 0 10*3/uL (ref 0.0–0.5)
Eosinophils Relative: 0 %
HCT: 47.5 % — ABNORMAL HIGH (ref 36.0–46.0)
Hemoglobin: 16.1 g/dL — ABNORMAL HIGH (ref 12.0–15.0)
Immature Granulocytes: 0 %
Lymphocytes Relative: 13 %
Lymphs Abs: 1.2 10*3/uL (ref 0.7–4.0)
MCH: 33.3 pg (ref 26.0–34.0)
MCHC: 33.9 g/dL (ref 30.0–36.0)
MCV: 98.1 fL (ref 80.0–100.0)
Monocytes Absolute: 1.7 10*3/uL — ABNORMAL HIGH (ref 0.1–1.0)
Monocytes Relative: 18 %
Neutro Abs: 6.4 10*3/uL (ref 1.7–7.7)
Neutrophils Relative %: 68 %
Platelets: 207 10*3/uL (ref 150–400)
RBC: 4.84 MIL/uL (ref 3.87–5.11)
RDW: 13.4 % (ref 11.5–15.5)
WBC: 9.3 10*3/uL (ref 4.0–10.5)
nRBC: 0 % (ref 0.0–0.2)

## 2019-03-27 LAB — COMPREHENSIVE METABOLIC PANEL
ALT: 17 U/L (ref 0–44)
AST: 24 U/L (ref 15–41)
Albumin: 4.7 g/dL (ref 3.5–5.0)
Alkaline Phosphatase: 70 U/L (ref 38–126)
Anion gap: 23 — ABNORMAL HIGH (ref 5–15)
BUN: 12 mg/dL (ref 6–20)
CO2: 23 mmol/L (ref 22–32)
Calcium: 9.7 mg/dL (ref 8.9–10.3)
Chloride: 88 mmol/L — ABNORMAL LOW (ref 98–111)
Creatinine, Ser: 0.84 mg/dL (ref 0.44–1.00)
GFR calc Af Amer: >60 mL/min (ref >60)
GFR calc non Af Amer: >60 mL/min (ref >60)
Glucose, Bld: 88 mg/dL (ref 70–99)
Potassium: 2.4 mmol/L — CL (ref 3.5–5.1)
Sodium: 134 mmol/L — ABNORMAL LOW (ref 135–145)
Total Bilirubin: 1.2 mg/dL (ref 0.3–1.2)
Total Protein: 10.2 g/dL — ABNORMAL HIGH (ref 6.5–8.1)

## 2019-03-27 LAB — I-STAT BETA HCG BLOOD, ED (MC, WL, AP ONLY): I-stat hCG, quantitative: <5 m[IU]/mL (ref <5)

## 2019-03-27 LAB — MAGNESIUM: Magnesium: 1.7 mg/dL (ref 1.7–2.4)

## 2019-03-27 MED ORDER — LORAZEPAM 2 MG/ML IJ SOLN: 0.0000 mg | Freq: Two times a day (BID) | INTRAMUSCULAR | Status: DC

## 2019-03-27 MED ORDER — POTASSIUM CHLORIDE CRYS ER 20 MEQ PO TBCR: 20.0000 meq | EXTENDED_RELEASE_TABLET | Freq: Two times a day (BID) | ORAL | 0 refills | Status: DC

## 2019-03-27 MED ORDER — MAGNESIUM SULFATE 50 % IJ SOLN: 2.0000 g | Freq: Once | INTRAMUSCULAR | Status: DC

## 2019-03-27 MED ORDER — CHLORHEXIDINE GLUCONATE 0.12 % MT SOLN: 15.0000 mL | Freq: Two times a day (BID) | OROMUCOSAL | 0 refills | Status: AC

## 2019-03-27 MED ORDER — LORAZEPAM 2 MG/ML IJ SOLN: 0.0000 mg | Freq: Four times a day (QID) | INTRAMUSCULAR | Status: DC

## 2019-03-27 MED ORDER — POTASSIUM CHLORIDE 20 MEQ PO PACK
20.0000 meq | PACK | Freq: Once | ORAL | Status: AC
  Administered 2019-03-27: 20 meq via ORAL

## 2019-03-27 MED ORDER — THIAMINE HCL 100 MG/ML IJ SOLN
100.0000 mg | Freq: Every day | INTRAMUSCULAR | Status: DC
  Administered 2019-03-27: 100 mg via INTRAVENOUS
  Filled 2019-03-27: qty 2

## 2019-03-27 MED ORDER — LORAZEPAM 1 MG PO TABS: 0.0000 mg | ORAL_TABLET | Freq: Two times a day (BID) | ORAL | Status: DC

## 2019-03-27 MED ORDER — POTASSIUM CHLORIDE 20 MEQ PO PACK
40.0000 meq | PACK | Freq: Once | ORAL | Status: AC
  Administered 2019-03-27: 12:00:00 40 meq via ORAL
  Filled 2019-03-27: qty 2

## 2019-03-27 MED ORDER — LORAZEPAM 2 MG/ML IJ SOLN
1.0000 mg | Freq: Once | INTRAMUSCULAR | Status: AC
  Administered 2019-03-27: 1 mg via INTRAVENOUS
  Filled 2019-03-27: qty 1

## 2019-03-27 MED ORDER — VITAMIN B-1 100 MG PO TABS: 100.0000 mg | ORAL_TABLET | Freq: Every day | ORAL | Status: DC

## 2019-03-27 MED ORDER — MAGNESIUM SULFATE 2 GM/50ML IV SOLN
2.0000 g | Freq: Once | INTRAVENOUS | Status: AC
  Administered 2019-03-27: 2 g via INTRAVENOUS
  Filled 2019-03-27: qty 50

## 2019-03-27 MED ORDER — POTASSIUM CHLORIDE 10 MEQ/100ML IV SOLN
10.0000 meq | INTRAVENOUS | Status: AC
  Administered 2019-03-27 (×2): 10 meq via INTRAVENOUS
  Filled 2019-03-27 (×2): qty 100

## 2019-03-27 MED ORDER — CHLORHEXIDINE GLUCONATE 0.12% ORAL RINSE (MEDLINE KIT): 15.0000 mL | Freq: Two times a day (BID) | OROMUCOSAL | Status: DC

## 2019-03-27 MED ORDER — LORAZEPAM 1 MG PO TABS: 0.0000 mg | ORAL_TABLET | Freq: Four times a day (QID) | ORAL | Status: DC

## 2019-03-27 NOTE — ED Notes (Signed)
Janeece Fitting, PA aware patients potassium is 2.4.

## 2019-03-27 NOTE — ED Triage Notes (Signed)
Patient reports she had a seizure last Thursday due to her attempting to withdraw herself from alcohol. Patient states she normally drinks "multiple drinks a day" and stopped drinking last Monday. Patient reports during her seizure on Thursday she bit her tongue hard.  Patient's tongue has a black portion on the left side and there is an odor coming from mouth. Patient reports she would also like some ativan to assist with her withdrawals.

## 2019-03-27 NOTE — Discharge Instructions (Addendum)
I have prescribed a rinse in order to help with your tongue laceration, please use this twice a day for the next 10 days.  The number to Dr. Edison Simon, ENT is attached to your chart please schedule an appointment for further management of this laceration.  A prescription for potassium has also been provided to you, take 1 tablet twice a day for the next 5 days.  If you experience any increase in Heart rate, tremors, vomiting or hallucinations please return immediately.

## 2019-03-27 NOTE — ED Provider Notes (Signed)
[facility]-EMERGENCY DEPT Provider Note   CSN: [id number] Arrival date & time: 03/27/19  1009     History   Chief Complaint Chief Complaint  Patient presents with  . Facial Laceration    HPI Angela Palmer is a 33 y.o. female.     33 y.o female with a PMH of Alcohol abuse, Hypokalemia, Hypomagnesemia presents to the ED with a chief complaint of tongue laceration. Patient reports she has been cutting down on her alcohol intake from 1 bottle of vodka every two days to slowly reducing the intake. Her last drink was yesterday, she does report having two seizures 3 days ago in which she bit BL sides of her tongue, she reports there is pain to the area along with changes in the color of her tongue. She has not placed any medication for improvement in symptoms. She has tried to withdrawal from alcohol in the past and according to her chart she was previously hospitalized for alcohol withdrawals in 2019.  Today she endorses sweatiness, slightly anxious, elevated heart rate. No shortness of breath, no hallucinations, no chest pain or vomiting.   The history is provided by the patient.    Past Medical History:  Diagnosis Date  . Alcohol abuse   . Seizures (HCC)    alcohol withdrawal seizures    Patient Active Problem List   Diagnosis Date Noted  . Hypomagnesemia 09/27/2017  . Seizure (HCC) 05/07/2016  . Alcohol dependence with withdrawal with complication (HCC) 05/07/2016  . Hypokalemia 05/07/2016  . Transaminitis 05/07/2016  . Metabolic acidosis 05/07/2016    History reviewed. No pertinent surgical history.   OB History   No obstetric history on file.      Home Medications    Prior to Admission medications   Medication Sig Start Date End Date Taking? Authorizing Provider  chlordiazePOXIDE (LIBRIUM) 25 MG capsule 50mg  PO TID x 1D, then 25-50mg  PO BID X 1D, then 25-50mg  PO QD X 1D 10/15/17   10/17/17, MD  chlorhexidine (PERIDEX) 0.12 % solution Use  as directed 15 mLs in the mouth or throat 2 (two) times daily for 10 days. 03/27/19 04/06/19  04/08/19, PA-C  folic acid (FOLVITE) 1 MG tablet Take 1 tablet (1 mg total) by mouth daily. 10/02/17   10/04/17, MD  Multiple Vitamin (MULTIVITAMIN WITH MINERALS) TABS tablet Take 1 tablet by mouth daily. 10/02/17   10/04/17, MD  potassium chloride SA (KLOR-CON) 20 MEQ tablet Take 1 tablet (20 mEq total) by mouth 2 (two) times daily for 5 days. 03/27/19 04/01/19  14/12/20, PA-C  thiamine 100 MG tablet Take 1 tablet (100 mg total) by mouth daily. 10/02/17   10/04/17, MD    Family History Family History  Problem Relation Age of Onset  . Hypertension Mother     Social History Social History   Tobacco Use  . Smoking status: Never Smoker  . Smokeless tobacco: Never Used  Substance Use Topics  . Alcohol use: Yes    Alcohol/week: 0.0 standard drinks    Comment: daily use-1/2-3/4 fifth of vodka   . Drug use: No     Allergies   Minocycline   Review of Systems Review of Systems  Constitutional: Negative for chills and fever.  HENT: Negative for ear pain and sore throat.   Eyes: Negative for pain and visual disturbance.  Respiratory: Negative for cough and shortness of breath.   Cardiovascular: Negative for chest pain and palpitations.  Gastrointestinal: Negative for abdominal pain and vomiting.  Genitourinary: Negative for dysuria and hematuria.  Musculoskeletal: Negative for arthralgias and back pain.  Skin: Positive for wound (tongue). Negative for color change and rash.  Neurological: Negative for seizures and syncope.  Psychiatric/Behavioral: The patient is nervous/anxious.   All other systems reviewed and are negative.    Physical Exam Updated Vital Signs BP 115/83   Pulse 93   Temp 98.6 F (37 C) (Oral)   Resp 17   LMP 03/01/2019 (Approximate)   SpO2 98%   Physical Exam Vitals signs and nursing note reviewed.  Constitutional:      Appearance:  Normal appearance.  HENT:     Head: Normocephalic and atraumatic.     Mouth/Throat:     Mouth: Mucous membranes are moist.     Tongue: Lesions present. Tongue does not deviate from midline.      Comments: Please see photos attached.  Eyes:     Pupils: Pupils are equal, round, and reactive to light.  Neck:     Musculoskeletal: Normal range of motion and neck supple.  Cardiovascular:     Rate and Rhythm: Tachycardia present.  Pulmonary:     Effort: Pulmonary effort is normal.  Abdominal:     General: Abdomen is flat.  Neurological:     Mental Status: She is alert and oriented to person, place, and time.          ED Treatments / Results  Labs (all labs ordered are listed, but only abnormal results are displayed) Labs Reviewed  CBC WITH DIFFERENTIAL/PLATELET - Abnormal; Notable for the following components:      Result Value   Hemoglobin 16.1 (*)    HCT 47.5 (*)    Monocytes Absolute 1.7 (*)    All other components within normal limits  COMPREHENSIVE METABOLIC PANEL - Abnormal; Notable for the following components:   Sodium 134 (*)    Potassium 2.4 (*)    Chloride 88 (*)    Total Protein 10.2 (*)    Anion gap 23 (*)    All other components within normal limits  MAGNESIUM  I-STAT BETA HCG BLOOD, ED ([facility], [facility], [facility] ONLY)    EKG EKG Interpretation  Date/Time:  Monday March 27 2019 13:55:04 EST Ventricular Rate:  99 PR Interval:    QRS Duration: 96 QT Interval:  385 QTC Calculation: 495 R Axis:   75 Text Interpretation: Sinus rhythm RSR' in V1 or V2, right VCD or RVH Borderline prolonged QT interval Confirmed by Virgel Manifold [phone]) on 03/27/2019 2:13:05 PM   Radiology No results found.  Procedures Procedures (including critical care time)  Medications Ordered in ED Medications  LORazepam (ATIVAN) injection 0-4 mg (0 mg Intravenous Hold 03/27/19 1111)    Or  LORazepam (ATIVAN) tablet 0-4 mg ( Oral See Alternative 03/27/19 1111)  LORazepam (ATIVAN)  injection 0-4 mg (has no administration in time range)    Or  LORazepam (ATIVAN) tablet 0-4 mg (has no administration in time range)  thiamine (VITAMIN B-1) tablet 100 mg ( Oral See Alternative 03/27/19 1137)    Or  thiamine (B-1) injection 100 mg (100 mg Intravenous Given 03/27/19 1137)  magnesium sulfate IVPB 2 g 50 mL (2 g Intravenous New Bag/Given 03/27/19 1428)  potassium chloride (KLOR-CON) packet 20 mEq (has no administration in time range)  potassium chloride (KLOR-CON) packet 40 mEq (40 mEq Oral Given 03/27/19 1218)  potassium chloride 10 mEq in 100 mL IVPB (0 mEq Intravenous Stopped 03/27/19 1427)  LORazepam (ATIVAN) injection 1 mg (1 mg Intravenous Given 03/27/19 1249)     Initial Impression / Assessment and Plan / ED Course  I have reviewed the triage vital signs and the nursing notes.  Pertinent labs & imaging results that were available during my care of the patient were reviewed by me and considered in my medical decision making (see chart for details).       Patient with a past medical history of alcohol withdrawals presents to the ED with complaints of tongue laceration after 2 consecutive seizures 3 days ago, patient reports has been trying to wean off her alcohol, she usually drinks about 1 bottle of vodka every 2 days.  Her last drink was yesterday.  Please see photos of tongue laceration, she does report some anxious, elevated heart rate, sweating feeling.  Will place ENT call for tongue evaluation.  Patient is agreeable obtaining some labs, will obtain CIWA score as she has previously been hospitalized for alcohol withdrawals.  11:17 AM Spoke to ENT on call who recommended follow outpatient in office, she is also to go home with a prescription for Peridex 15 ml swip and spit BID x 10 days.   CMP remarkable for hypokalemia at 2.4, significantly decreased from her previous levels. LFTs are within normal limits.  CBC without any electrolyte derangement, ECG with no signs of  ischemia or QTC prolongation.  She received 1 of Ativan while waiting in the ED.  She also received thiamine, CIWA score is currently a 0.  Is currently receiving potassium replacement along with magnesium replacement.   3:03 PM Magnesium level was checked prior to replacement, she did receive 2 g of magnesium.  She was also given Ativan, patient reports improvement in her symptoms.  She has received IV potassium along with p.o., she will go home with a prescription for potassium.  Discussed risks and benefits of admitting for further management of her alcohol withdrawals.  However, patient reports she feels that she is able to do this at home as she has in the past.  I encouraged her that she had a prior admission due to alcohol withdrawals, she does reports she would rather go home at this time. Her CIWA score is zero, she is hemodynamically stable.  Heart rate is now 93. No active vomiting, tolerating PO. Patient stable for discharge.  I discussed case with Dr. Juleen ChinaKohut who agrees with plan and management.  Portions of this note were generated with Scientist, clinical (histocompatibility and immunogenetics)Dragon dictation software. Dictation errors may occur despite best attempts at proofreading.  Final Clinical Impressions(s) / ED Diagnoses   Final diagnoses:  Laceration of tongue, initial encounter  Hypokalemia    ED Discharge Orders         Ordered    potassium chloride SA (KLOR-CON) 20 MEQ tablet  2 times daily     03/27/19 1459    chlorhexidine (PERIDEX) 0.12 % solution  2 times daily     03/27/19 1501           Claude MangesSoto, Shawnna Pancake, PA-C 03/27/19 1507    Raeford RazorKohut, Stephen, MD 03/28/19 [phone]

## 2019-05-11 ENCOUNTER — Other Ambulatory Visit: Payer: Self-pay

## 2019-05-11 ENCOUNTER — Emergency Department (HOSPITAL_COMMUNITY)
Admission: EM | Admit: 2019-05-11 | Discharge: 2019-05-12 | Disposition: A | Discharge status: Home / Self Care | Payer: Self-pay | Attending: Emergency Medicine | Admitting: Emergency Medicine

## 2019-05-11 ENCOUNTER — Encounter (HOSPITAL_COMMUNITY): Payer: Self-pay | Admitting: Emergency Medicine

## 2019-05-11 DIAGNOSIS — F10929 Alcohol use, unspecified with intoxication, unspecified: Secondary | ICD-10-CM | POA: Insufficient documentation

## 2019-05-11 DIAGNOSIS — R569 Unspecified convulsions: Secondary | ICD-10-CM | POA: Insufficient documentation

## 2019-05-11 LAB — CBC WITH DIFFERENTIAL/PLATELET
Abs Immature Granulocytes: 0.03 10*3/uL (ref 0.00–0.07)
Basophils Absolute: 0 10*3/uL (ref 0.0–0.1)
Basophils Relative: 0 %
Eosinophils Absolute: 0 10*3/uL (ref 0.0–0.5)
Eosinophils Relative: 0 %
HCT: 39.8 % (ref 36.0–46.0)
Hemoglobin: 13.5 g/dL (ref 12.0–15.0)
Immature Granulocytes: 0 %
Lymphocytes Relative: 4 %
Lymphs Abs: 0.4 10*3/uL — ABNORMAL LOW (ref 0.7–4.0)
MCH: 33.3 pg (ref 26.0–34.0)
MCHC: 33.9 g/dL (ref 30.0–36.0)
MCV: 98.3 fL (ref 80.0–100.0)
Monocytes Absolute: 0.7 10*3/uL (ref 0.1–1.0)
Monocytes Relative: 7 %
Neutro Abs: 8.9 10*3/uL — ABNORMAL HIGH (ref 1.7–7.7)
Neutrophils Relative %: 89 %
Platelets: 126 10*3/uL — ABNORMAL LOW (ref 150–400)
RBC: 4.05 MIL/uL (ref 3.87–5.11)
RDW: 14.6 % (ref 11.5–15.5)
WBC: 10 10*3/uL (ref 4.0–10.5)
nRBC: 0 % (ref 0.0–0.2)

## 2019-05-11 LAB — COMPREHENSIVE METABOLIC PANEL
ALT: 32 U/L (ref 0–44)
AST: 49 U/L — ABNORMAL HIGH (ref 15–41)
Albumin: 4.5 g/dL (ref 3.5–5.0)
Alkaline Phosphatase: 76 U/L (ref 38–126)
Anion gap: 16 — ABNORMAL HIGH (ref 5–15)
BUN: 6 mg/dL (ref 6–20)
CO2: 25 mmol/L (ref 22–32)
Calcium: 9.4 mg/dL (ref 8.9–10.3)
Chloride: 93 mmol/L — ABNORMAL LOW (ref 98–111)
Creatinine, Ser: 0.7 mg/dL (ref 0.44–1.00)
GFR calc Af Amer: >60 mL/min (ref >60)
GFR calc non Af Amer: >60 mL/min (ref >60)
Glucose, Bld: 156 mg/dL — ABNORMAL HIGH (ref 70–99)
Potassium: 3.5 mmol/L (ref 3.5–5.1)
Sodium: 134 mmol/L — ABNORMAL LOW (ref 135–145)
Total Bilirubin: 1.5 mg/dL — ABNORMAL HIGH (ref 0.3–1.2)
Total Protein: 8.3 g/dL — ABNORMAL HIGH (ref 6.5–8.1)

## 2019-05-11 LAB — URINALYSIS, ROUTINE W REFLEX MICROSCOPIC
Glucose, UA: NEGATIVE mg/dL
Ketones, ur: >80 mg/dL — AB
Nitrite: POSITIVE — AB
Protein, ur: >300 mg/dL — AB
Specific Gravity, Urine: >1.03 — ABNORMAL HIGH (ref 1.005–1.030)
pH: 6.5 (ref 5.0–8.0)

## 2019-05-11 LAB — URINALYSIS, MICROSCOPIC (REFLEX): WBC, UA: >50 WBC/hpf (ref 0–5)

## 2019-05-11 LAB — I-STAT BETA HCG BLOOD, ED (MC, WL, AP ONLY): I-stat hCG, quantitative: <5 m[IU]/mL (ref <5)

## 2019-05-11 LAB — ETHANOL: Alcohol, Ethyl (B): <10 mg/dL (ref <10)

## 2019-05-11 NOTE — ED Triage Notes (Signed)
Patient reports seizures x4 today with emesis , patient stated ETOH daily , denies suicidal ideation , alert and oriented/respirations unlabored , denies injury from seizures / ambulatory.

## 2019-05-11 NOTE — ED Notes (Signed)
Pt called to recheck vital signs, no answer x 1 

## 2019-05-12 NOTE — ED Notes (Signed)
Pt called for vitals recheck x2 with no response. 

## 2019-05-12 NOTE — ED Notes (Signed)
Patient called three times for vitals recheck with no response and not visible in lobby 

## 2019-06-26 ENCOUNTER — Other Ambulatory Visit: Payer: Self-pay

## 2019-06-26 ENCOUNTER — Encounter (HOSPITAL_COMMUNITY): Payer: Self-pay | Admitting: Internal Medicine

## 2019-06-26 ENCOUNTER — Inpatient Hospital Stay (HOSPITAL_COMMUNITY)
Admission: EM | Admit: 2019-06-26 | Discharge: 2019-06-27 | DRG: 101 | Disposition: A | Discharge status: Home / Self Care | Payer: Self-pay | Attending: Internal Medicine | Admitting: Internal Medicine

## 2019-06-26 ENCOUNTER — Inpatient Hospital Stay (HOSPITAL_COMMUNITY): Payer: Self-pay

## 2019-06-26 ENCOUNTER — Emergency Department (HOSPITAL_COMMUNITY): Payer: Self-pay

## 2019-06-26 DIAGNOSIS — G40509 Epileptic seizures related to external causes, not intractable, without status epilepticus: Principal | ICD-10-CM | POA: Diagnosis present

## 2019-06-26 DIAGNOSIS — R9431 Abnormal electrocardiogram [ECG] [EKG]: Secondary | ICD-10-CM | POA: Diagnosis present

## 2019-06-26 DIAGNOSIS — F10230 Alcohol dependence with withdrawal, uncomplicated: Secondary | ICD-10-CM

## 2019-06-26 DIAGNOSIS — S2241XA Multiple fractures of ribs, right side, initial encounter for closed fracture: Secondary | ICD-10-CM

## 2019-06-26 DIAGNOSIS — Z20822 Contact with and (suspected) exposure to covid-19: Secondary | ICD-10-CM | POA: Diagnosis present

## 2019-06-26 DIAGNOSIS — R7401 Elevation of levels of liver transaminase levels: Secondary | ICD-10-CM

## 2019-06-26 DIAGNOSIS — F10939 Alcohol use, unspecified with withdrawal, unspecified: Secondary | ICD-10-CM

## 2019-06-26 DIAGNOSIS — S2249XA Multiple fractures of ribs, unspecified side, initial encounter for closed fracture: Secondary | ICD-10-CM | POA: Diagnosis present

## 2019-06-26 DIAGNOSIS — N3001 Acute cystitis with hematuria: Secondary | ICD-10-CM

## 2019-06-26 DIAGNOSIS — W010XXA Fall on same level from slipping, tripping and stumbling without subsequent striking against object, initial encounter: Secondary | ICD-10-CM | POA: Diagnosis present

## 2019-06-26 DIAGNOSIS — W19XXXA Unspecified fall, initial encounter: Secondary | ICD-10-CM

## 2019-06-26 DIAGNOSIS — Z598 Other problems related to housing and economic circumstances: Secondary | ICD-10-CM

## 2019-06-26 DIAGNOSIS — K760 Fatty (change of) liver, not elsewhere classified: Secondary | ICD-10-CM | POA: Diagnosis present

## 2019-06-26 DIAGNOSIS — Z23 Encounter for immunization: Secondary | ICD-10-CM

## 2019-06-26 DIAGNOSIS — Z881 Allergy status to other antibiotic agents status: Secondary | ICD-10-CM

## 2019-06-26 DIAGNOSIS — F10239 Alcohol dependence with withdrawal, unspecified: Secondary | ICD-10-CM | POA: Diagnosis present

## 2019-06-26 DIAGNOSIS — Z8249 Family history of ischemic heart disease and other diseases of the circulatory system: Secondary | ICD-10-CM

## 2019-06-26 DIAGNOSIS — R7989 Other specified abnormal findings of blood chemistry: Secondary | ICD-10-CM | POA: Diagnosis present

## 2019-06-26 DIAGNOSIS — Z793 Long term (current) use of hormonal contraceptives: Secondary | ICD-10-CM

## 2019-06-26 DIAGNOSIS — F10930 Alcohol use, unspecified with withdrawal, uncomplicated: Secondary | ICD-10-CM

## 2019-06-26 DIAGNOSIS — R569 Unspecified convulsions: Secondary | ICD-10-CM

## 2019-06-26 DIAGNOSIS — Z560 Unemployment, unspecified: Secondary | ICD-10-CM

## 2019-06-26 DIAGNOSIS — G40409 Other generalized epilepsy and epileptic syndromes, not intractable, without status epilepticus: Secondary | ICD-10-CM

## 2019-06-26 DIAGNOSIS — E876 Hypokalemia: Secondary | ICD-10-CM

## 2019-06-26 LAB — COMPREHENSIVE METABOLIC PANEL
ALT: 27 U/L (ref 0–44)
AST: 43 U/L — ABNORMAL HIGH (ref 15–41)
Albumin: 4.1 g/dL (ref 3.5–5.0)
Alkaline Phosphatase: 60 U/L (ref 38–126)
Anion gap: 18 — ABNORMAL HIGH (ref 5–15)
BUN: 6 mg/dL (ref 6–20)
CO2: 25 mmol/L (ref 22–32)
Calcium: 9.7 mg/dL (ref 8.9–10.3)
Chloride: 93 mmol/L — ABNORMAL LOW (ref 98–111)
Creatinine, Ser: 0.56 mg/dL (ref 0.44–1.00)
GFR calc Af Amer: >60 mL/min (ref >60)
GFR calc non Af Amer: >60 mL/min (ref >60)
Glucose, Bld: 114 mg/dL — ABNORMAL HIGH (ref 70–99)
Potassium: 3.2 mmol/L — ABNORMAL LOW (ref 3.5–5.1)
Sodium: 136 mmol/L (ref 135–145)
Total Bilirubin: 1 mg/dL (ref 0.3–1.2)
Total Protein: 7.9 g/dL (ref 6.5–8.1)

## 2019-06-26 LAB — I-STAT BETA HCG BLOOD, ED (MC, WL, AP ONLY): I-stat hCG, quantitative: <5 m[IU]/mL (ref <5)

## 2019-06-26 LAB — CBC WITH DIFFERENTIAL/PLATELET
Abs Immature Granulocytes: 0.02 10*3/uL (ref 0.00–0.07)
Basophils Absolute: 0.1 10*3/uL (ref 0.0–0.1)
Basophils Relative: 1 %
Eosinophils Absolute: 0 10*3/uL (ref 0.0–0.5)
Eosinophils Relative: 0 %
HCT: 39.9 % (ref 36.0–46.0)
Hemoglobin: 13.6 g/dL (ref 12.0–15.0)
Immature Granulocytes: 0 %
Lymphocytes Relative: 14 %
Lymphs Abs: 1.2 10*3/uL (ref 0.7–4.0)
MCH: 32.2 pg (ref 26.0–34.0)
MCHC: 34.1 g/dL (ref 30.0–36.0)
MCV: 94.5 fL (ref 80.0–100.0)
Monocytes Absolute: 0.5 10*3/uL (ref 0.1–1.0)
Monocytes Relative: 6 %
Neutro Abs: 6.6 10*3/uL (ref 1.7–7.7)
Neutrophils Relative %: 79 %
Platelets: 198 10*3/uL (ref 150–400)
RBC: 4.22 MIL/uL (ref 3.87–5.11)
RDW: 13.2 % (ref 11.5–15.5)
WBC: 8.3 10*3/uL (ref 4.0–10.5)
nRBC: 0 % (ref 0.0–0.2)

## 2019-06-26 LAB — BASIC METABOLIC PANEL
Anion gap: 12 (ref 5–15)
BUN: <5 mg/dL — ABNORMAL LOW (ref 6–20)
CO2: 23 mmol/L (ref 22–32)
Calcium: 8.6 mg/dL — ABNORMAL LOW (ref 8.9–10.3)
Chloride: 100 mmol/L (ref 98–111)
Creatinine, Ser: 0.52 mg/dL (ref 0.44–1.00)
GFR calc Af Amer: >60 mL/min (ref >60)
GFR calc non Af Amer: >60 mL/min (ref >60)
Glucose, Bld: 86 mg/dL (ref 70–99)
Potassium: 3.6 mmol/L (ref 3.5–5.1)
Sodium: 135 mmol/L (ref 135–145)

## 2019-06-26 LAB — MAGNESIUM
Magnesium: 1.1 mg/dL — ABNORMAL LOW (ref 1.7–2.4)
Magnesium: 1.9 mg/dL (ref 1.7–2.4)

## 2019-06-26 LAB — HEPATITIS B SURFACE ANTIGEN: Hepatitis B Surface Ag: NONREACTIVE

## 2019-06-26 LAB — URINALYSIS, ROUTINE W REFLEX MICROSCOPIC
Bilirubin Urine: NEGATIVE
Glucose, UA: NEGATIVE mg/dL
Ketones, ur: 5 mg/dL — AB
Nitrite: POSITIVE — AB
Protein, ur: 100 mg/dL — AB
Specific Gravity, Urine: 1.018 (ref 1.005–1.030)
WBC, UA: >50 WBC/hpf — ABNORMAL HIGH (ref 0–5)
pH: 6 (ref 5.0–8.0)

## 2019-06-26 LAB — SARS CORONAVIRUS 2 (TAT 6-24 HRS): SARS Coronavirus 2: NEGATIVE

## 2019-06-26 LAB — HIV ANTIBODY (ROUTINE TESTING W REFLEX): HIV Screen 4th Generation wRfx: NONREACTIVE

## 2019-06-26 LAB — HEPATITIS C ANTIBODY: HCV Ab: NONREACTIVE

## 2019-06-26 LAB — HEPATITIS B CORE ANTIBODY, TOTAL: Hep B Core Total Ab: NONREACTIVE

## 2019-06-26 LAB — PHOSPHORUS: Phosphorus: 3.4 mg/dL (ref 2.5–4.6)

## 2019-06-26 LAB — LIPASE, BLOOD: Lipase: 44 U/L (ref 11–51)

## 2019-06-26 MED ORDER — LORAZEPAM 1 MG PO TABS
1.0000 mg | ORAL_TABLET | ORAL | Status: DC | PRN
  Administered 2019-06-26: 2 mg via ORAL
  Filled 2019-06-26: qty 2

## 2019-06-26 MED ORDER — SODIUM CHLORIDE 0.9 % IV SOLN
1.0000 g | Freq: Once | INTRAVENOUS | Status: AC
  Administered 2019-06-26: 1 g via INTRAVENOUS
  Filled 2019-06-26: qty 10

## 2019-06-26 MED ORDER — LORAZEPAM 1 MG PO TABS
0.0000 mg | ORAL_TABLET | Freq: Four times a day (QID) | ORAL | Status: DC
  Administered 2019-06-26: 1 mg via ORAL
  Filled 2019-06-26: qty 1

## 2019-06-26 MED ORDER — LORAZEPAM 1 MG PO TABS: 0.0000 mg | ORAL_TABLET | Freq: Two times a day (BID) | ORAL | Status: DC

## 2019-06-26 MED ORDER — THIAMINE HCL 100 MG PO TABS
100.0000 mg | ORAL_TABLET | Freq: Every day | ORAL | Status: DC
  Filled 2019-06-26: qty 1

## 2019-06-26 MED ORDER — ENOXAPARIN SODIUM 40 MG/0.4ML ~~LOC~~ SOLN
40.0000 mg | SUBCUTANEOUS | Status: DC
  Administered 2019-06-26: 40 mg via SUBCUTANEOUS
  Filled 2019-06-26: qty 0.4

## 2019-06-26 MED ORDER — SODIUM CHLORIDE 0.9 % IV BOLUS
1000.0000 mL | Freq: Once | INTRAVENOUS | Status: AC
  Administered 2019-06-26: 1000 mL via INTRAVENOUS

## 2019-06-26 MED ORDER — ACETAMINOPHEN 650 MG RE SUPP: 650.0000 mg | Freq: Four times a day (QID) | RECTAL | Status: DC | PRN

## 2019-06-26 MED ORDER — LORAZEPAM 2 MG/ML IJ SOLN
0.0000 mg | Freq: Four times a day (QID) | INTRAMUSCULAR | Status: DC
  Administered 2019-06-26: 1 mg via INTRAVENOUS
  Filled 2019-06-26: qty 1

## 2019-06-26 MED ORDER — THIAMINE HCL 100 MG/ML IJ SOLN: 100.0000 mg | Freq: Every day | INTRAMUSCULAR | Status: DC

## 2019-06-26 MED ORDER — MORPHINE SULFATE (PF) 4 MG/ML IV SOLN
4.0000 mg | Freq: Once | INTRAVENOUS | Status: AC
  Administered 2019-06-26: 4 mg via INTRAVENOUS
  Filled 2019-06-26: qty 1

## 2019-06-26 MED ORDER — FOLIC ACID 1 MG PO TABS
1.0000 mg | ORAL_TABLET | Freq: Every day | ORAL | Status: DC
  Administered 2019-06-26 – 2019-06-27 (×2): 1 mg via ORAL
  Filled 2019-06-26 (×2): qty 1

## 2019-06-26 MED ORDER — LORAZEPAM 2 MG/ML IJ SOLN: 0.0000 mg | Freq: Two times a day (BID) | INTRAMUSCULAR | Status: DC

## 2019-06-26 MED ORDER — ADULT MULTIVITAMIN W/MINERALS CH
1.0000 | ORAL_TABLET | Freq: Every day | ORAL | Status: DC
  Administered 2019-06-26 – 2019-06-27 (×2): 1 via ORAL
  Filled 2019-06-26 (×2): qty 1

## 2019-06-26 MED ORDER — SODIUM CHLORIDE 0.9% FLUSH
3.0000 mL | Freq: Two times a day (BID) | INTRAVENOUS | Status: DC
  Administered 2019-06-26 – 2019-06-27 (×2): 3 mL via INTRAVENOUS

## 2019-06-26 MED ORDER — CHLORDIAZEPOXIDE HCL 25 MG PO CAPS
25.0000 mg | ORAL_CAPSULE | Freq: Three times a day (TID) | ORAL | Status: DC
  Administered 2019-06-26 – 2019-06-27 (×4): 25 mg via ORAL
  Filled 2019-06-26 (×4): qty 1

## 2019-06-26 MED ORDER — MAGNESIUM SULFATE 2 GM/50ML IV SOLN
2.0000 g | Freq: Once | INTRAVENOUS | Status: AC
  Administered 2019-06-26: 2 g via INTRAVENOUS
  Filled 2019-06-26: qty 50

## 2019-06-26 MED ORDER — THIAMINE HCL 100 MG PO TABS
100.0000 mg | ORAL_TABLET | Freq: Every day | ORAL | Status: DC
  Administered 2019-06-26 – 2019-06-27 (×2): 100 mg via ORAL
  Filled 2019-06-26 (×2): qty 1

## 2019-06-26 MED ORDER — THIAMINE HCL 100 MG/ML IJ SOLN
100.0000 mg | Freq: Every day | INTRAMUSCULAR | Status: DC
  Administered 2019-06-26: 100 mg via INTRAVENOUS
  Filled 2019-06-26: qty 2

## 2019-06-26 MED ORDER — INFLUENZA VAC SPLIT QUAD 0.5 ML IM SUSY
0.5000 mL | PREFILLED_SYRINGE | INTRAMUSCULAR | Status: AC
  Administered 2019-06-27: 0.5 mL via INTRAMUSCULAR
  Filled 2019-06-26: qty 0.5

## 2019-06-26 MED ORDER — LORAZEPAM 2 MG/ML IJ SOLN
1.0000 mg | INTRAMUSCULAR | Status: DC | PRN
  Administered 2019-06-27: 2 mg via INTRAVENOUS
  Filled 2019-06-26 (×2): qty 1

## 2019-06-26 MED ORDER — POTASSIUM CHLORIDE CRYS ER 20 MEQ PO TBCR
40.0000 meq | EXTENDED_RELEASE_TABLET | Freq: Two times a day (BID) | ORAL | Status: AC
  Administered 2019-06-26 (×2): 40 meq via ORAL
  Filled 2019-06-26 (×2): qty 2

## 2019-06-26 MED ORDER — LEVONORGESTREL-ETHINYL ESTRAD 0.1-20 MG-MCG PO TABS: 1.0000 | ORAL_TABLET | Freq: Every day | ORAL | Status: DC

## 2019-06-26 MED ORDER — ACETAMINOPHEN 325 MG PO TABS: 650.0000 mg | ORAL_TABLET | Freq: Four times a day (QID) | ORAL | Status: DC | PRN

## 2019-06-26 MED ORDER — POTASSIUM CHLORIDE 10 MEQ/100ML IV SOLN
10.0000 meq | Freq: Once | INTRAVENOUS | Status: AC
  Administered 2019-06-26: 10 meq via INTRAVENOUS
  Filled 2019-06-26: qty 100

## 2019-06-26 MED ORDER — HYDROCODONE-ACETAMINOPHEN 5-325 MG PO TABS
1.0000 | ORAL_TABLET | Freq: Four times a day (QID) | ORAL | Status: DC | PRN
  Administered 2019-06-26: 2 via ORAL
  Filled 2019-06-26: qty 2

## 2019-06-26 NOTE — ED Notes (Signed)
Pt called out for worsening tremors and increased anxiety since receiving PO ativan and IV morphine. PA made aware, verbal order for another 1mg  ativan and NS bolus.

## 2019-06-26 NOTE — ED Notes (Signed)
Lunch Tray Ordered @ 1038.  

## 2019-06-26 NOTE — H&P (Signed)
Date: 06/26/2019               Patient Name:  Angela Palmer MRN: [id number]  DOB: 1985/05/11 Age / Sex: 34 y.o., female   PCP: Patient, No Pcp Per         Medical Service: Internal Medicine Teaching Service         Attending Physician: Dr. Heber , Rachel Moulds, DO    First Contact: Dr. Darrick Meigs Pager: [phone]  Second Contact: Dr. Sharon Seller Pager: [phone]       After Hours (After 5p/  First Contact Pager: [phone]  weekends / holidays): Second Contact Pager: [phone]   Chief Complaint: RUQ pain and tremors   History of Present Illness: Angela Palmer is a 34 year old female with a medical history of alcohol use disorder previously admitted for withdrawal seizures who presents today for evaluation of right upper quadrant pain.  Patient was in her usual state of health until 3 days ago when she tripped and fell on her right side.  She has been having right upper quadrant pain since then. She did not seek evaluation at the time as she thought the pain was just due to mild bruising. This morning the pain was not improving and she was having difficulty taking deep breaths which prompted her to come to the ED.  In the ED she was noted to have tremors and chills which she commonly experiences when she is withdrawing from alcohol.  Shortly after she had a grand mal seizure that lasted about 2 minutes and resolved with 1 mg of Ativan.  She was postictal for 15-30 minutes. She does not recall the event. She reports drinking about 4 shots of whiskey or vodka per day for the past 5 years at least. Her last drink was yesterday afternoon around 3 pm.   She denies HA, fever, chest pain, shortness of breath, abdominal pain, N/V, changes in appetite, changes in bowel movements, and urinary symptoms. Denies recent illness and exposures to COVID-19 virus.   ED course: On arrival, patient was afebrile, tachycardic to 120s, hypertensive 161/106 and RR 16 with O2 sats 96% on RA. Blood work remarkable for K 3.2,  AST 43, Mg 1.1, UA with nitrites, leukocytes and many bacteria. Ribs imaging showed acute fractures of ribs 8, 11, and 12. She received morphine, Ativan 1 mg, IV K 10 mEq, Mag 2 g, and Rocephin.    Meds: Confirmed with patient.  Current Meds  Medication Sig  . levonorgestrel-ethinyl estradiol (ALESSE) 0.1-20 MG-MCG tablet Take 1 tablet by mouth daily.    Allergies: Allergies as of 06/26/2019 - Review Complete 06/26/2019  Allergen Reaction Noted  . Minocycline Swelling 09/26/2017   Past Medical History:  Diagnosis Date  . Alcohol abuse   . Seizures ([place])    alcohol withdrawal seizures    Family History:  Family History  Problem Relation Age of Onset  . Hypertension Mother     Social History: Patient does not have a stable home. Currently living in a house with 3 roommates. Currently unemployed, lost her car recently and was let go due to inability to get to work. She denies tobacco and IV drug use.    Review of Systems: A complete ROS was negative except as per HPI.  Physical Exam: Blood pressure (!) 135/99, pulse (!) 108, temperature 98.5 F (36.9 C), temperature source Oral, resp. rate 12, height 5\' 5"  (1.651 m), weight 56.7 kg, last menstrual period 06/19/2019, SpO2 100 %.  General: young  female, tired appearing, in NAD  HENT: NCAT, neck supple and FROM, OP clear without exudates or erythema, tongue trauma bilaterally  Eyes: anicteric sclera, PERRL Cardiac: tachycardic, nl S1/S2, no murmurs, rubs or gallops  Pulm: CTAB, no wheezes or crackles, no increased work of breathing on RA  Abd: soft, TTP on RUQ, non-distended, bowel sounds are normoactive  Neuro: A&Ox3, CN II-XII intact, sensation intact in all four extremities, no motor deficits, mild asterixis   Ext: warm and well perfused, no peripheral edema  Derm: several bruises of different ages over bilateral LE   Psych: attentive, appropriate affect, answers questions appropriately    EKG: personally reviewed my  interpretation is sinus tachycardia, QT prolongation   R ribs XR: 1. Acute right eighth, eleventh, and twelfth rib fractures with up to mild displacement. 2. Remote right tenth and eleventh rib fractures. 3. No pneumothorax.   Assessment & Plan by Problem: Principal Problem:   Alcohol withdrawal syndrome with complication (HCC) Active Problems:   Hypokalemia   Transaminitis   Hypomagnesemia  # Alcohol withdrawal with seizures: Five-year history of alcohol use with history of seizures in the setting of withdrawal. Had one seizure in the ED that responded to Ativan. Last drink about 20 hours ago.  - Admit to stepdown with cardiac monitoring  - Scheduled Librium 25 mg TID  - Ativan 1 mg PRN  - CIWA  - MVI + folate + thiamine   # Rib fractures secondary to mechanical fall: No pneumothorax on imaging. EDP consulted trauma surgery.  - Follow up trauma recommendations  - Norco 5-325 1-2 tablets q6h PRN for pain - Will need to monitor respiratory status while on PRN opiates and scheduled benzos for alcohol withdrawal  - IS  # Transaminitis: mild AST elevation but history of elevated LFTs since 2018. No RUQ [place] or hepatitis B and C serologies in chart.  - RUQ [place] - Hep B and Hep C studies   # Electrolyte disturbance: hypokalemia and hypomagnesemia likely secondary to poor PO intake in the setting of alcohol use disorder.  - Replete PRN   Dispo: Admit patient to Inpatient with expected length of stay greater than 2 midnights.  SignedBurna Cash, MD 06/26/2019, 11:31 AM  Pager: [phone]

## 2019-06-26 NOTE — ED Notes (Addendum)
Pt HR observed to be 175 on central monitor. This NT entered room to pt having a grand-mal seizure with arms contracted. Mykenzie, Charity fundraiser entered behind and notified Margaux, [place]. All at bedside with seizure lasting approx. 1 min. Smear of blood suctioned from pt mouth post-st ictal.

## 2019-06-26 NOTE — ED Triage Notes (Signed)
Pt arrives with chief complaint of right upper quadrant pain that started on Friday. Pt states she fell and hit the corner of a table. No bruising noted. Pt also appears to have tremors and states she is withdrawing from alcohol, her last drink was yesterday afternoon.

## 2019-06-26 NOTE — ED Provider Notes (Addendum)
[facility] EMERGENCY DEPARTMENT Provider Note   CSN: [id number] Arrival date & time: 06/26/19  6433     History Chief Complaint  Patient presents with  . Abdominal Pain    Angela Palmer is a 34 y.o. female with PMHx alcohol abuse and alcohol withdrawal seizures who presents to the ED today complaining of sudden onset, constant, worsening, right sided rib pain s/p mechanical fall that occurred 3 days ago.  Reports she tripped and fell hitting the corner of a coffee table on her right side 3 days ago.  She states she had immediate pain to the area and it is progressively been getting worse.  She states it is difficult for her to take a deep breath in due to pain.  She reports she has been taking Tylenol with mild relief.   Is also complaining of tremors.  She states that she is withdrawing from alcohol.  She last drank two shots of vodka yesterday afternoon.  Patient states she typically drinks about four shots of alcohol per day.  Patient does have a history of alcohol withdrawal and alcohol withdrawal seizures for which she has to be admitted for in 2019.  Denies auditory of visual hallucinations, vomiting, fevers, or any other associated symptoms. Pt does endorse some chills and nausea.   The history is provided by the patient and medical records.       Past Medical History:  Diagnosis Date  . Alcohol abuse   . Seizures (HCC)    alcohol withdrawal seizures    Patient Active Problem List   Diagnosis Date Noted  . Alcohol withdrawal syndrome with complication (HCC) 06/26/2019  . Hypomagnesemia 09/27/2017  . Seizure (HCC) 05/07/2016  . Alcohol dependence with withdrawal with complication (HCC) 05/07/2016  . Hypokalemia 05/07/2016  . Transaminitis 05/07/2016  . Metabolic acidosis 05/07/2016    No past surgical history on file.   OB History   No obstetric history on file.     Family History  Problem Relation Age of Onset  . Hypertension Mother     Social History   Tobacco Use  . Smoking status: Never Smoker  . Smokeless tobacco: Never Used  Substance Use Topics  . Alcohol use: Yes    Alcohol/week: 0.0 standard drinks    Comment: daily use-1/2-3/4 fifth of vodka   . Drug use: No    Home Medications Prior to Admission medications   Medication Sig Start Date End Date Taking? Authorizing Provider  levonorgestrel-ethinyl estradiol (ALESSE) 0.1-20 MG-MCG tablet Take 1 tablet by mouth daily. 05/22/13  Yes [provider]  chlordiazePOXIDE (LIBRIUM) 25 MG capsule 50mg  PO TID x 1D, then 25-50mg  PO BID X 1D, then 25-50mg  PO QD X 1D Patient not taking: Reported on 06/26/2019 10/15/17   10/17/17, MD  folic acid (FOLVITE) 1 MG tablet Take 1 tablet (1 mg total) by mouth daily. Patient not taking: Reported on 06/26/2019 10/02/17   10/04/17, MD  Multiple Vitamin (MULTIVITAMIN WITH MINERALS) TABS tablet Take 1 tablet by mouth daily. Patient not taking: Reported on 06/26/2019 10/02/17   10/04/17, MD  potassium chloride SA (KLOR-CON) 20 MEQ tablet Take 1 tablet (20 mEq total) by mouth 2 (two) times daily for 5 days. Patient not taking: Reported on 06/26/2019 03/27/19 06/25/28  08/25/28, PA-C  thiamine 100 MG tablet Take 1 tablet (100 mg total) by mouth daily. Patient not taking: Reported on 06/26/2019 10/02/17   10/04/17, MD  Allergies    Minocycline  Review of Systems   Review of Systems  Constitutional: Positive for chills. Negative for fever.  Respiratory: Negative for cough and shortness of breath.   Cardiovascular:       + right rib pain  Gastrointestinal: Positive for nausea. Negative for abdominal pain, diarrhea and vomiting.  Neurological: Negative for seizures.  Psychiatric/Behavioral: Negative for hallucinations.  All other systems reviewed and are negative.   Physical Exam Updated Vital Signs BP (!) 154/108   Pulse (!) 103   Temp 98.5 F (36.9 C) (Oral)   Resp 20   Ht 5\' 5"  (1.651 m)   Wt 56.7 kg    LMP 06/19/2019 (Approximate)   SpO2 96%   BMI 20.80 kg/m   Physical Exam Vitals and nursing note reviewed.  Constitutional:      Appearance: She is not ill-appearing or diaphoretic.  HENT:     Head: Normocephalic and atraumatic.  Eyes:     Conjunctiva/sclera: Conjunctivae normal.  Cardiovascular:     Rate and Rhythm: Normal rate and regular rhythm.     Heart sounds: Normal heart sounds.  Pulmonary:     Effort: Pulmonary effort is normal.     Breath sounds: Normal breath sounds. No wheezing, rhonchi or rales.     Comments: Right sided lower rib TTP, > over anterior portion however tender throughout lateral and posterior aspect of ribs Chest:     Chest wall: Tenderness present.  Abdominal:     Palpations: Abdomen is soft.     Tenderness: There is abdominal tenderness in the right upper quadrant. There is no guarding or rebound.     Comments: Soft,  + tenderness to RUQ however suspect this is referred pain from rib tenderness, +BS throughout, no r/g/r, neg murphy's, neg mcburney's, no CVA TTP  Musculoskeletal:     Cervical back: Neck supple.  Skin:    General: Skin is warm and dry.  Neurological:     Mental Status: She is alert.     ED Results / Procedures / Treatments   Labs (all labs ordered are listed, but only abnormal results are displayed) Labs Reviewed  COMPREHENSIVE METABOLIC PANEL - Abnormal; Notable for the following components:      Result Value   Potassium 3.2 (*)    Chloride 93 (*)    Glucose, Bld 114 (*)    AST 43 (*)    Anion gap 18 (*)    All other components within normal limits  URINALYSIS, ROUTINE W REFLEX MICROSCOPIC - Abnormal; Notable for the following components:   APPearance CLOUDY (*)    Hgb urine dipstick SMALL (*)    Ketones, ur 5 (*)    Protein, ur 100 (*)    Nitrite POSITIVE (*)    Leukocytes,Ua LARGE (*)    WBC, UA >50 (*)    Bacteria, UA MANY (*)    All other components within normal limits  MAGNESIUM - Abnormal; Notable for the  following components:   Magnesium 1.1 (*)    All other components within normal limits  SARS CORONAVIRUS 2 (TAT 6-24 HRS)  URINE CULTURE  CBC WITH DIFFERENTIAL/PLATELET  LIPASE, BLOOD  HIV ANTIBODY (ROUTINE TESTING W REFLEX)  HEPATITIS B SURFACE ANTIGEN  HEPATITIS B SURFACE ANTIBODY, QUANTITATIVE  HEPATITIS B CORE ANTIBODY, TOTAL  HEPATITIS C ANTIBODY  BASIC METABOLIC PANEL  MAGNESIUM  PHOSPHORUS  I-STAT BETA HCG BLOOD, ED ([facility], [facility], [facility] ONLY)    EKG EKG Interpretation  Date/Time:  Monday June 26 2019 06:55:00 EST Ventricular Rate:  108 PR Interval:    QRS Duration: 95 QT Interval:  375 QTC Calculation: 503 R Axis:   65 Text Interpretation: Sinus tachycardia Prolonged QT interval Non-specific ST-t changes Confirmed by Lajean Saver [phone]) on 06/26/2019 7:09:54 AM   Radiology DG Ribs Unilateral W/Chest Right  Result Date: 06/26/2019 CLINICAL DATA:  Right rib pain after fall EXAM: RIGHT RIBS AND CHEST - 3+ VIEW COMPARISON:  None. FINDINGS: Mildly displaced lateral right eighth rib fracture. Remote posterior right tenth and eleventh rib fracture. Mildly displaced posterior right twelfth rib fracture. Nondisplaced posterior right eleventh rib fracture. No evidence of hemothorax or pneumothorax. Normal heart size and mediastinal contours. IMPRESSION: 1. Acute right eighth, eleventh, and twelfth rib fractures with up to mild displacement. 2. Remote right tenth and eleventh rib fractures. 3. No pneumothorax. Electronically Signed   By: Monte Fantasia M.D.   On: 06/26/2019 07:24   US Abdomen Limited RUQ  Result Date: 06/26/2019 CLINICAL DATA:  Abdominal pain with elevated liver enzymes EXAM: ULTRASOUND ABDOMEN LIMITED RIGHT UPPER QUADRANT COMPARISON:  None. FINDINGS: Gallbladder: No gallstones or wall thickening visualized. There is no pericholecystic fluid. No sonographic Murphy sign noted by sonographer. Common bile duct: Diameter: 3 mm. No intrahepatic or extrahepatic biliary duct  dilatation. Liver: No focal lesion identified. Liver echogenicity overall is increased. Portal vein is patent on color Doppler imaging with normal direction of blood flow towards the liver. Other: There is trace ascites. IMPRESSION: 1. Diffuse increase in liver echogenicity, a finding indicative of hepatic steatosis. There may well be underlying parenchymal liver disease. No focal liver lesions are evident; it must be cautioned that the sensitivity of ultrasound for detection of focal liver lesions is somewhat diminished in this circumstance. 2.  Trace ascites. Electronically Signed   By: Lowella Grip III M.D.   On: 06/26/2019 12:07    Procedures .Critical Care Performed by: Eustaquio Maize, PA-C Authorized by: Eustaquio Maize, PA-C   Critical care provider statement:    Critical care time (minutes):  50   Critical care was necessary to treat or prevent imminent or life-threatening deterioration of the following conditions:  [place]   Critical care was time spent personally by me on the following activities:  Discussions with consultants, evaluation of patient's response to treatment, examination of patient, ordering and performing treatments and interventions, ordering and review of laboratory studies, ordering and review of radiographic studies, pulse oximetry, re-evaluation of patient's condition, obtaining history from patient or surrogate and review of old charts   (including critical care time)  Medications Ordered in ED Medications  levonorgestrel-ethinyl estradiol (ALESSE) 0.1-20 MG-MCG per tablet 1 tablet (has no administration in time range)  LORazepam (ATIVAN) tablet 1-4 mg (2 mg Oral Given 06/26/19 1314)    Or  LORazepam (ATIVAN) injection 1-4 mg ( Intravenous See Alternative 06/26/19 1314)  thiamine tablet 100 mg (100 mg Oral Given 06/26/19 1230)    Or  thiamine (B-1) injection 100 mg ( Intravenous See Alternative 08/23/29 4970)  folic acid (FOLVITE) tablet 1 mg (1 mg Oral Given  06/26/19 1230)  multivitamin with minerals tablet 1 tablet (1 tablet Oral Given 06/26/19 1231)  enoxaparin (LOVENOX) injection 40 mg (has no administration in time range)  sodium chloride flush (NS) 0.9 % injection 3 mL (3 mLs Intravenous Not Given 06/26/19 1050)  acetaminophen (TYLENOL) tablet 650 mg (has no administration in time range)    Or  acetaminophen (TYLENOL) suppository 650 mg (has no administration in time  range)  chlordiazePOXIDE (LIBRIUM) capsule 25 mg (25 mg Oral Given 06/26/19 1230)  potassium chloride SA (KLOR-CON) CR tablet 40 mEq (40 mEq Oral Given 06/26/19 1231)  HYDROcodone-acetaminophen (NORCO/VICODIN) 5-325 MG per tablet 1-2 tablet (has no administration in time range)  morphine 4 MG/ML injection 4 mg (4 mg Intravenous Given 06/26/19 0739)  sodium chloride 0.9 % bolus 1,000 mL (0 mLs Intravenous Stopped 06/26/19 0935)  cefTRIAXone (ROCEPHIN) 1 g in sodium chloride 0.9 % 100 mL IVPB (0 g Intravenous Stopped 06/26/19 0906)  potassium chloride 10 mEq in 100 mL IVPB (0 mEq Intravenous Stopped 06/26/19 0935)  magnesium sulfate IVPB 2 g 50 mL (0 g Intravenous Stopped 06/26/19 1234)    ED Course  I have reviewed the triage vital signs and the nursing notes.  Pertinent labs & imaging results that were available during my care of the patient were reviewed by me and considered in my medical decision making (see chart for details).  Clinical Course as of Jun 25 1540  Mon Jun 26, 2019  0636 Pulse Rate(!): 103 [MV]  0732 8, 10-12 rib fractures on R side  DG Ribs Unilateral W/Chest Right [MV]  0820 Potassium(!): 3.2 [MV]  0822 + nitrites, + leuks, > 50 WBCs per HPF  Urinalysis, Routine w reflex microscopic(!) [MV]  1019 Magnesium(!): 1.1 [MV]  1026 Pulse Rate(!): 114 [MV]    Clinical Course User Index [MV] Tanda RockersVenter, Markasia Carrol, PA-C   34 year old female with history of alcohol abuse and alcohol withdrawal seizures who presents the ED today complaining of right-sided pain.  Triage initially  reported this is abdominal pain however patient is more tender along her right lateral ribs status post mechanical fall that occurred 3 days ago.  She also reports withdrawing from the hall with last drink yesterday.  Arrival to the ED patient is tachycardic in the low 100s however afebrile and nontachypneic.  Blood pressure mildly elevated.  Will obtain chest x-ray at this time, screening labs, CIWA protocol.   Nursing staff called me to room after pt found actively seizing. Seizures lasted approximately 2 minutes with post ictal state. It does appear pt bit her tongue in the process as well. Another 1 mg Ativan IV given. Pt will need to be admitted for DTs.   CXR with multiple rib fractures on right side. Morphine given for pain control.   U/A with positive leuks, nitrites and > 50 WBCs per HPF. Will order 1 g Ceftriaxone at this time.   CBC without leukocytosis. Hgb stable.  CMP with potassium 3.2, will replete. Mag level ordered. Chloride low at 93, pt receiving fluids at this time. Glucose 114. Gap of 18. Bicarb within normal limits at 25. No other electrolyte abnormalities.  Lipase 44.  Beta hcg negative.   Awaiting consult to trauma for involvement given multiple rib fractures with mild displacement however patient will need medicine admission given her alcohol withdrawal seizure, UTI, and hypokalemia. Will consult medicine as well.   Discussed case with hospitalist who agrees to accept patient for admission. Still have not heard back from trauma - may need to be reconsulted after admission if further workup is required with pt's rib fractures.   This note was prepared using Dragon voice recognition software and may include unintentional dictation errors due to the inherent limitations of voice recognition software.   MDM Rules/Calculators/A&P                       Final Clinical Impression(s) /  ED Diagnoses Final diagnoses:  Alcohol withdrawal seizure without complication (HCC)   Closed fracture of multiple ribs of right side, initial encounter  Acute cystitis with hematuria  Hypokalemia  Hypomagnesemia  Transaminitis    Rx / DC Orders ED Discharge Orders    None       Tanda Rockers, PA-C 06/26/19 1020    [address] PA-C 06/26/19 1543    Cathren Laine, MD 06/27/19 [phone]

## 2019-06-26 NOTE — ED Notes (Signed)
Pt's O2 sat decreased to 89% on RA. RN placed pt on 2L [place], O2 sat now 98%

## 2019-06-26 NOTE — ED Notes (Addendum)
Pt found by Ladona Ridgel NT and this RN to be having a seizure, arms contracted and pt foaming at the mouthing. Margaux PA notified, verbal order given for 1mg  IV ativan. Administered at 0802 by this RN. Seizure lasted approx 1 min. Pt bit tongue on L side.

## 2019-06-26 NOTE — ED Notes (Signed)
Pt still postictal but now speaking and following commands, but drowsy. ao to self and place only.

## 2019-06-26 NOTE — ED Notes (Signed)
Pt is post-ictal, not speaking or following  Commands. VSS w/ Hr in 130s. NS bolus started

## 2019-06-26 NOTE — ED Notes (Signed)
Pt returned from imaging.

## 2019-06-27 DIAGNOSIS — S2249XA Multiple fractures of ribs, unspecified side, initial encounter for closed fracture: Secondary | ICD-10-CM | POA: Diagnosis present

## 2019-06-27 DIAGNOSIS — R569 Unspecified convulsions: Secondary | ICD-10-CM

## 2019-06-27 DIAGNOSIS — W010XXA Fall on same level from slipping, tripping and stumbling without subsequent striking against object, initial encounter: Secondary | ICD-10-CM

## 2019-06-27 DIAGNOSIS — F10239 Alcohol dependence with withdrawal, unspecified: Secondary | ICD-10-CM

## 2019-06-27 LAB — COMPREHENSIVE METABOLIC PANEL
ALT: 20 U/L (ref 0–44)
AST: 32 U/L (ref 15–41)
Albumin: 3.8 g/dL (ref 3.5–5.0)
Alkaline Phosphatase: 54 U/L (ref 38–126)
Anion gap: 12 (ref 5–15)
BUN: <5 mg/dL — ABNORMAL LOW (ref 6–20)
CO2: 24 mmol/L (ref 22–32)
Calcium: 9 mg/dL (ref 8.9–10.3)
Chloride: 98 mmol/L (ref 98–111)
Creatinine, Ser: 0.47 mg/dL (ref 0.44–1.00)
GFR calc Af Amer: >60 mL/min (ref >60)
GFR calc non Af Amer: >60 mL/min (ref >60)
Glucose, Bld: 106 mg/dL — ABNORMAL HIGH (ref 70–99)
Potassium: 3.8 mmol/L (ref 3.5–5.1)
Sodium: 134 mmol/L — ABNORMAL LOW (ref 135–145)
Total Bilirubin: 1.1 mg/dL (ref 0.3–1.2)
Total Protein: 6.9 g/dL (ref 6.5–8.1)

## 2019-06-27 LAB — MAGNESIUM: Magnesium: 1.9 mg/dL (ref 1.7–2.4)

## 2019-06-27 LAB — MRSA PCR SCREENING: MRSA by PCR: NEGATIVE

## 2019-06-27 LAB — HEPATITIS B SURFACE ANTIBODY, QUANTITATIVE: Hep B S AB Quant (Post): 857.2 m[IU]/mL (ref >9.9)

## 2019-06-27 LAB — PHOSPHORUS: Phosphorus: 3.4 mg/dL (ref 2.5–4.6)

## 2019-06-27 MED ORDER — HYDROCODONE-ACETAMINOPHEN 5-325 MG PO TABS: 1.0000 | ORAL_TABLET | Freq: Four times a day (QID) | ORAL | 0 refills | Status: AC | PRN

## 2019-06-27 MED ORDER — NALTREXONE HCL 50 MG PO TABS: 50.0000 mg | ORAL_TABLET | Freq: Every day | ORAL | 0 refills | Status: DC

## 2019-06-27 MED ORDER — FOLIC ACID 1 MG PO TABS: 1.0000 mg | ORAL_TABLET | Freq: Every day | ORAL | 0 refills | Status: DC

## 2019-06-27 MED ORDER — CHLORDIAZEPOXIDE HCL 25 MG PO CAPS: ORAL_CAPSULE | ORAL | 0 refills | Status: AC

## 2019-06-27 MED ORDER — LEVONORGESTREL-ETHINYL ESTRAD 0.1-20 MG-MCG PO TABS: 1.0000 | ORAL_TABLET | Freq: Every day | ORAL | 11 refills | Status: DC

## 2019-06-27 MED ORDER — ADULT MULTIVITAMIN W/MINERALS CH: 1.0000 | ORAL_TABLET | Freq: Every day | ORAL | 0 refills | Status: DC

## 2019-06-27 MED ORDER — THIAMINE HCL 100 MG PO TABS: 100.0000 mg | ORAL_TABLET | Freq: Every day | ORAL | 0 refills | Status: DC

## 2019-06-27 NOTE — Discharge Summary (Signed)
Name: Angela Palmer MRN: [id number] DOB: 05/19/1985 34 y.o. PCP: Patient, No Pcp Per  Date of Admission: 06/26/2019  6:22 AM Date of Discharge: 3/9/20213/9/21 Attending Physician: No att. providers found  Discharge Diagnosis: 1. Mildly displaced rib fractures--#8, 11,12. 2. Alcohol dependence disorder  3. Alcohol withdrawal seizure  Discharge Medications: Allergies as of 06/27/2019      Reactions   Minocycline Swelling   Swelling of the face       Medication List    STOP taking these medications   potassium chloride SA 20 MEQ tablet Commonly known as: KLOR-CON     TAKE these medications   chlordiazePOXIDE 25 MG capsule Commonly known as: LIBRIUM Take 1 capsule (25 mg total) by mouth 3 (three) times daily for 1 day, THEN 1 capsule (25 mg total) 2 (two) times daily for 1 day, THEN 1 capsule (25 mg total) daily for 1 day. Start taking on: June 27, 2019 What changed: See the new instructions.   folic acid 1 MG tablet Commonly known as: FOLVITE Take 1 tablet (1 mg total) by mouth daily.   HYDROcodone-acetaminophen 5-325 MG tablet Commonly known as: NORCO/VICODIN Take 1 tablet by mouth every 6 (six) hours as needed for up to 5 days for moderate pain or severe pain.   levonorgestrel-ethinyl estradiol 0.1-20 MG-MCG tablet Commonly known as: ALESSE Take 1 tablet by mouth daily.   multivitamin with minerals Tabs tablet Take 1 tablet by mouth daily.   naltrexone 50 MG tablet Commonly known as: DEPADE Take 1 tablet (50 mg total) by mouth daily.   thiamine 100 MG tablet Take 1 tablet (100 mg total) by mouth daily.       Disposition and follow-up:   [name] was discharged from [facility] in Stable condition.  At the hospital follow up visit please address:  1.  Rib fractures. Discharged with 5mg  norco #15. Please evaluate for improved pain and decreasing need for norco. I would be hesitant to prescribe more in the setting of alcohol  use disorder and concern for further addiction. Encourage ibuprofen if she is continuing to experience discomfort. 2. Alcohol use disorder. Naltrexone ordered at discharge with instructions to not take until she is done with the norco. Please also ensure that she has received resources for community resources such as AA.  Follow-up Appointments: Follow-up Information    [facility] Follow up.   Contact information: [address] [place] [phone]       [facility] .   Contact information: [address] [place] [phone]          Hospital Course: Angela Palmer is a 34 yo female with a history of alcohol dependence (4 shots a night) and related withdrawal seizures who presented to the [facility] ED on 06/26/19 for evaluation of RUQ pain after falling over a coffee table. Chest imaging revealed mildly displaced rib fractures but no other concerning findings such as pneumothorax. While in the ED, patient experienced a witnessed seizure. She had noted that her last drink was the prior afternoon so given her history of withdrawal seizures, it was believed this was a reoccurrence. The seizure resolved with ativan and no further seizures occurred during her hospitalization. We discussed her alcohol use and she was open to attempting cessation. She had one prior 4w period of abstinence while in an inpatient rehabilitation program in the past however  began drinking following completion due to that being a common practice in her group of friends.  On discharge, she was given a prescription for naltrexone and encouraged to follow up with an outpatient therapy group to further assist her. She was also encouraged to establish with a primary care physician for closer monitoring.  Discharge Vitals:   BP 121/90 (BP Location: Right Arm)   Pulse 91   Temp 98.2 F (36.8 C) (Oral)    Resp 18   Ht 5\' 5"  (1.651 m)   Wt 57.2 kg   LMP 06/19/2019 (Approximate)   SpO2 98%   BMI 20.98 kg/m   Pertinent Labs, Studies, and Procedures:   Unilateral Rib/Chest Xray: acute right 8th, 11th, 12th rib fractures with mild displacement. Remote right 10th and 11th rib fractures. No pneumothorax.  RUQ [place]: diffuse increase in liver echogenicity consistent with hepatic steatosis.  Discharge Instructions: Discharge Instructions    Discharge instructions   Complete by: As directed    Please follow up with a primary care office within 7 days of discharge.      SignedMitzi Hansen, MD 06/28/2019, 7:17 AM   Pager: [phone]

## 2019-06-27 NOTE — TOC Progression Note (Signed)
Transition of Care [facility]) - Progression Note    Patient Details  Name: Angela Palmer MRN: [id number] Date of Birth: Jun 14, 1985  Transition [facility] Care [facility]) CM/SW Contact  Leone Haven, RN Phone Number: 06/27/2019, 12:50 PM  Clinical Narrative:    NCM spoke with patient, she needed ast with medications, NCM assisted her with the Match Letter.  She will follow up with the [facility] clinic or the [facility] internal med clinic for follow up apt.  NCM will see if can make follow before she leaves she states her ride is coming after 3 pm.        Expected Discharge Plan and Services           Expected Discharge Date: 06/27/19                                     Social Determinants of Health (SDOH) Interventions    Readmission Risk Interventions No flowsheet data found.

## 2019-06-27 NOTE — Plan of Care (Signed)
  Problem: Education: Goal: Knowledge of General Education information will improve Description: Including pain rating scale, medication(s)/side effects and non-pharmacologic comfort measures Outcome: Progressing   Problem: Health Behavior/Discharge Planning: Goal: Ability to manage health-related needs will improve Outcome: Progressing   Problem: Clinical Measurements: Goal: Will remain free from infection Outcome: Progressing Goal: Diagnostic test results will improve Outcome: Progressing Goal: Cardiovascular complication will be avoided Outcome: Progressing   Problem: Nutrition: Goal: Adequate nutrition will be maintained Outcome: Progressing   Problem: Coping: Goal: Level of anxiety will decrease Outcome: Progressing   Problem: Pain Managment: Goal: General experience of comfort will improve Outcome: Progressing

## 2019-06-27 NOTE — TOC Progression Note (Signed)
Transition of Care [facility]) - Progression Note    Patient Details  Name: [name] MRN: [id number] Date of Birth: 02/16/1986  Transition of Care [facility]) CM/SW [place], [place] Phone Number: 06/27/2019, 1:21 PM  Clinical Narrative:      CSW met with patient at bedside and provided list of substance abuse resources , patient agreeable to identify which facility would be best for her and follow up with the phone numbers listed. She reports no further dc needs and states she has a ride coming at 3pm for discharge.      Expected Discharge Plan and Services           Expected Discharge Date: 06/27/19                                     Social Determinants of Health (SDOH) Interventions    Readmission Risk Interventions No flowsheet data found.

## 2019-06-27 NOTE — Discharge Instructions (Signed)
It was nice to meet you!  Your chest xray did show some rib fractures which is likely what is causing you to have pain. Unfortunately these can take a while to heal (6-12 weeks) and can be pretty painful but should slowly improve. I sent in a prescription for a few days of pain medications but ibuprofen can also be pretty helpful and would try that first. Use the Norco only if the pain is not sufficiently controlled with ibuprofen.  The seizure that you had in the ER was most likely related to you withdrawing from alcohol. The most important thing for you to do is to stop drinking. This is not an easy thing to do and you will most likely find it helpful to be surrounded by a supportive team. You are welcome to be seen in our clinic or community health and wellness (numbers listed on your discharge paperwork) for a hospital follow up at which time we can see how you are doing with the Naltrexone (to help reduce cravings). Do not start taking this until you are done needing the Norco as it will counteract it. I have also sent in a prescription for Librium which will help smooth out the withdrawal signs.  Thank you for placing your trust in the Internal Medicine team and we wish you the best!

## 2019-06-27 NOTE — Progress Notes (Signed)
NAME:  Angela Palmer, MRN:  [id number], DOB:  Jun 06, 1985, LOS: 1 ADMISSION DATE:  06/26/2019  Subjective  Discussed history of prior seizures and prior attempts at alcohol cessation. Did have about 3-4w being alcohol free in the past when she   Objective   Blood pressure (!) 132/92, pulse 96, temperature 98.2 F (36.8 C), temperature source Oral, resp. rate 14, height 5\' 5"  (1.651 m), weight 57.2 kg, last menstrual period 06/19/2019, SpO2 100 %.     Intake/Output Summary (Last 24 hours) at 06/27/2019 1104 Last data filed at 06/27/2019 0330 Gross per 24 hour  Intake 480 ml  Output --  Net 480 ml   Filed Weights   06/26/19 0634 06/26/19 2243  Weight: 56.7 kg 57.2 kg    Examination: GENERAL: in no acute distress CARDIAC: heart RRR.  PULMONARY: Lung sounds clear to auscultation. TTP over the right anterior, inferior chest wall. ABDOMEN: bs active NEURO: alert and oriented SKIN: no rash or lesions on limited exam  PSYCH: Normal affect  Labs    CBC Latest Ref Rng & Units 06/26/2019 05/11/2019 03/27/2019  WBC 4.0 - 10.5 K/uL 8.3 10.0 9.3  Hemoglobin 12.0 - 15.0 g/dL 14/10/2018 48.1 16.1(H)  Hematocrit 36.0 - 46.0 % 39.9 39.8 47.5(H)  Platelets 150 - 400 K/uL 198 126(L) 207   BMP Latest Ref Rng & Units 06/27/2019 06/26/2019 06/26/2019  Glucose 70 - 99 mg/dL 08/26/2019) 86 314(H)  BUN 6 - 20 mg/dL 702(O) <3(Z) 6  Creatinine 0.44 - 1.00 mg/dL <8(H 8.85 0.27  Sodium 135 - 145 mmol/L 134(L) 135 136  Potassium 3.5 - 5.1 mmol/L 3.8 3.6 3.2(L)  Chloride 98 - 111 mmol/L 98 100 93(L)  CO2 22 - 32 mmol/L 24 23 25   Calcium 8.9 - 10.3 mg/dL 9.0 7.41) 9.7    Summary  34 yo female with a history substance abuse who presented to the ED on 3/8 for evaluation of abdominal pain following a fall on 06/23/19. While in the ED, she developed a seizure which is believed to be related to alcohol withdrawal.  Assessment & Plan:  Principal Problem:   Alcohol withdrawal syndrome with complication (HCC) Active  Problems:   Hypokalemia   Transaminitis   Hypomagnesemia   Fracture of multiple ribs  Fall  Displaced rib fractures--#8,11,12. Minimally displaced on CXR. No other noted trauma on imaging. No acute findings on RUQ 5/8. No issues from a respiratory status this morning. Norco for pain which pt appears to be doing well with.  Plan: discussed that these injuries typically take an extended recovery period. Will discharge with norco 5mg  #15. Encouraged her to transition to ibuprofen as soon as possible to avoid compounding substance dependence. She seemed to have good insight into this concept.   Alcohol dependence disorder -RUQ 08/23/19 findings consistent with hepatic steatosis. Supported by LFTs and patient history of alcohol use disorder. Would expect the development of liver cirrhosis if behavior doesn't change. Risks of continued alcohol use discussed with [place]. -we also discussed her overall hx of alcohol use. She seems motivated to stop using alcohol however struggles due to friends/enviroment/etc.  -Encouraged follow up with outpatient care such as AA or other group therapy--case management will provide. We also discussed pharmaceutical options to help her with cessation including naltrexone. She relayed interest in these options and will start naltrexone after discharge once she has finished her requiring norco. We discussed that she will require close outpatient follow up with a PCP which she does not currently  have. ETOH withdrawal with seizure. Appears this is a fairly long standing issue for her resulting in numerous ED visits/hospitalizations.Librium started on admission. CIWAs stable overnight--only required 1-2 doses of ativan overnight.   Best practice:  CODE STATUS: full Dispo: stable for discharge today   Mitzi Hansen, MD [place] PGY-1 PAGER #: [phone] 06/27/19  11:04 AM

## 2019-06-28 LAB — URINE CULTURE: Culture: >=100000 — AB

## 2019-08-03 ENCOUNTER — Other Ambulatory Visit: Payer: Self-pay

## 2019-08-03 ENCOUNTER — Encounter (HOSPITAL_COMMUNITY): Payer: Self-pay | Admitting: Emergency Medicine

## 2019-08-03 ENCOUNTER — Observation Stay (HOSPITAL_COMMUNITY)
Admission: EM | Admit: 2019-08-03 | Discharge: 2019-08-04 | Disposition: A | Discharge status: Home / Self Care | Payer: Self-pay | Attending: Internal Medicine | Admitting: Internal Medicine

## 2019-08-03 DIAGNOSIS — F10929 Alcohol use, unspecified with intoxication, unspecified: Secondary | ICD-10-CM | POA: Diagnosis present

## 2019-08-03 DIAGNOSIS — E872 Acidosis: Secondary | ICD-10-CM | POA: Insufficient documentation

## 2019-08-03 DIAGNOSIS — E878 Other disorders of electrolyte and fluid balance, not elsewhere classified: Secondary | ICD-10-CM

## 2019-08-03 DIAGNOSIS — Z881 Allergy status to other antibiotic agents status: Secondary | ICD-10-CM

## 2019-08-03 DIAGNOSIS — F10229 Alcohol dependence with intoxication, unspecified: Secondary | ICD-10-CM

## 2019-08-03 DIAGNOSIS — F10239 Alcohol dependence with withdrawal, unspecified: Secondary | ICD-10-CM

## 2019-08-03 DIAGNOSIS — E8729 Other acidosis: Secondary | ICD-10-CM

## 2019-08-03 DIAGNOSIS — R748 Abnormal levels of other serum enzymes: Secondary | ICD-10-CM

## 2019-08-03 DIAGNOSIS — F1010 Alcohol abuse, uncomplicated: Secondary | ICD-10-CM

## 2019-08-03 DIAGNOSIS — Z793 Long term (current) use of hormonal contraceptives: Secondary | ICD-10-CM | POA: Insufficient documentation

## 2019-08-03 DIAGNOSIS — E876 Hypokalemia: Secondary | ICD-10-CM | POA: Insufficient documentation

## 2019-08-03 DIAGNOSIS — Y908 Blood alcohol level of 240 mg/100 ml or more: Secondary | ICD-10-CM | POA: Insufficient documentation

## 2019-08-03 DIAGNOSIS — K709 Alcoholic liver disease, unspecified: Secondary | ICD-10-CM | POA: Insufficient documentation

## 2019-08-03 DIAGNOSIS — F10129 Alcohol abuse with intoxication, unspecified: Principal | ICD-10-CM | POA: Insufficient documentation

## 2019-08-03 DIAGNOSIS — Z20822 Contact with and (suspected) exposure to covid-19: Secondary | ICD-10-CM | POA: Insufficient documentation

## 2019-08-03 LAB — COMPREHENSIVE METABOLIC PANEL
ALT: 85 U/L — ABNORMAL HIGH (ref 0–44)
AST: 253 U/L — ABNORMAL HIGH (ref 15–41)
Albumin: 4.5 g/dL (ref 3.5–5.0)
Alkaline Phosphatase: 123 U/L (ref 38–126)
Anion gap: 24 — ABNORMAL HIGH (ref 5–15)
BUN: 7 mg/dL (ref 6–20)
CO2: 20 mmol/L — ABNORMAL LOW (ref 22–32)
Calcium: 8.9 mg/dL (ref 8.9–10.3)
Chloride: 93 mmol/L — ABNORMAL LOW (ref 98–111)
Creatinine, Ser: 0.68 mg/dL (ref 0.44–1.00)
GFR calc Af Amer: >60 mL/min (ref >60)
GFR calc non Af Amer: >60 mL/min (ref >60)
Glucose, Bld: 81 mg/dL (ref 70–99)
Potassium: 3.3 mmol/L — ABNORMAL LOW (ref 3.5–5.1)
Sodium: 137 mmol/L (ref 135–145)
Total Bilirubin: 1.4 mg/dL — ABNORMAL HIGH (ref 0.3–1.2)
Total Protein: 7.9 g/dL (ref 6.5–8.1)

## 2019-08-03 LAB — RAPID URINE DRUG SCREEN, HOSP PERFORMED
Amphetamines: NOT DETECTED
Barbiturates: NOT DETECTED
Benzodiazepines: POSITIVE — AB
Cocaine: NOT DETECTED
Opiates: NOT DETECTED
Tetrahydrocannabinol: NOT DETECTED

## 2019-08-03 LAB — BASIC METABOLIC PANEL
Anion gap: 17 — ABNORMAL HIGH (ref 5–15)
BUN: 6 mg/dL (ref 6–20)
CO2: 21 mmol/L — ABNORMAL LOW (ref 22–32)
Calcium: 7.8 mg/dL — ABNORMAL LOW (ref 8.9–10.3)
Chloride: 98 mmol/L (ref 98–111)
Creatinine, Ser: 0.61 mg/dL (ref 0.44–1.00)
GFR calc Af Amer: >60 mL/min (ref >60)
GFR calc non Af Amer: >60 mL/min (ref >60)
Glucose, Bld: 154 mg/dL — ABNORMAL HIGH (ref 70–99)
Potassium: 3 mmol/L — ABNORMAL LOW (ref 3.5–5.1)
Sodium: 136 mmol/L (ref 135–145)

## 2019-08-03 LAB — CBC
HCT: 44.3 % (ref 36.0–46.0)
Hemoglobin: 14.8 g/dL (ref 12.0–15.0)
MCH: 31.8 pg (ref 26.0–34.0)
MCHC: 33.4 g/dL (ref 30.0–36.0)
MCV: 95.1 fL (ref 80.0–100.0)
Platelets: 154 10*3/uL (ref 150–400)
RBC: 4.66 MIL/uL (ref 3.87–5.11)
RDW: 14.8 % (ref 11.5–15.5)
WBC: 3.6 10*3/uL — ABNORMAL LOW (ref 4.0–10.5)
nRBC: 0 % (ref 0.0–0.2)

## 2019-08-03 LAB — MAGNESIUM: Magnesium: 1.2 mg/dL — ABNORMAL LOW (ref 1.7–2.4)

## 2019-08-03 LAB — ETHANOL: Alcohol, Ethyl (B): 377 mg/dL (ref <10)

## 2019-08-03 LAB — I-STAT BETA HCG BLOOD, ED (MC, WL, AP ONLY): I-stat hCG, quantitative: <5 m[IU]/mL (ref <5)

## 2019-08-03 LAB — PHOSPHORUS: Phosphorus: 1.9 mg/dL — ABNORMAL LOW (ref 2.5–4.6)

## 2019-08-03 MED ORDER — SODIUM CHLORIDE 0.9 % IV BOLUS
1000.0000 mL | Freq: Once | INTRAVENOUS | Status: AC
  Administered 2019-08-03: 19:00:00 1000 mL via INTRAVENOUS

## 2019-08-03 MED ORDER — THIAMINE HCL 100 MG PO TABS
100.0000 mg | ORAL_TABLET | Freq: Every day | ORAL | Status: DC
  Administered 2019-08-04: 10:00:00 100 mg via ORAL
  Filled 2019-08-03: qty 1

## 2019-08-03 MED ORDER — CHLORDIAZEPOXIDE HCL 25 MG PO CAPS: 25.0000 mg | ORAL_CAPSULE | Freq: Every day | ORAL | Status: DC

## 2019-08-03 MED ORDER — THIAMINE HCL 100 MG/ML IJ SOLN
100.0000 mg | Freq: Once | INTRAMUSCULAR | Status: AC
  Administered 2019-08-03: 100 mg via INTRAVENOUS
  Filled 2019-08-03: qty 2

## 2019-08-03 MED ORDER — ENOXAPARIN SODIUM 40 MG/0.4ML ~~LOC~~ SOLN
40.0000 mg | SUBCUTANEOUS | Status: DC
  Administered 2019-08-03: 40 mg via SUBCUTANEOUS
  Filled 2019-08-03: qty 0.4

## 2019-08-03 MED ORDER — CHLORDIAZEPOXIDE HCL 25 MG PO CAPS
25.0000 mg | ORAL_CAPSULE | Freq: Four times a day (QID) | ORAL | Status: DC
  Administered 2019-08-03 – 2019-08-04 (×2): 25 mg via ORAL
  Filled 2019-08-03 (×2): qty 1

## 2019-08-03 MED ORDER — LORAZEPAM 2 MG/ML IJ SOLN
2.0000 mg | Freq: Once | INTRAMUSCULAR | Status: AC
  Administered 2019-08-03: 19:00:00 2 mg via INTRAVENOUS
  Filled 2019-08-03: qty 1

## 2019-08-03 MED ORDER — THIAMINE HCL 100 MG PO TABS: 100.0000 mg | ORAL_TABLET | Freq: Every day | ORAL | Status: DC

## 2019-08-03 MED ORDER — SODIUM CHLORIDE 0.9 % IV BOLUS
1000.0000 mL | Freq: Once | INTRAVENOUS | Status: AC
  Administered 2019-08-03: 1000 mL via INTRAVENOUS

## 2019-08-03 MED ORDER — ADULT MULTIVITAMIN W/MINERALS CH
1.0000 | ORAL_TABLET | Freq: Every day | ORAL | Status: DC
  Administered 2019-08-03 – 2019-08-04 (×2): 1 via ORAL
  Filled 2019-08-03 (×2): qty 1

## 2019-08-03 MED ORDER — POTASSIUM CHLORIDE CRYS ER 20 MEQ PO TBCR
40.0000 meq | EXTENDED_RELEASE_TABLET | Freq: Once | ORAL | Status: AC
  Administered 2019-08-03: 40 meq via ORAL
  Filled 2019-08-03: qty 2

## 2019-08-03 MED ORDER — CHLORDIAZEPOXIDE HCL 25 MG PO CAPS: 25.0000 mg | ORAL_CAPSULE | ORAL | Status: DC

## 2019-08-03 MED ORDER — THIAMINE HCL 100 MG/ML IJ SOLN: 100.0000 mg | Freq: Once | INTRAMUSCULAR | Status: DC

## 2019-08-03 MED ORDER — SODIUM CHLORIDE 0.9 % IV BOLUS
1000.0000 mL | Freq: Once | INTRAVENOUS | Status: AC
  Administered 2019-08-03: 21:00:00 1000 mL via INTRAVENOUS

## 2019-08-03 MED ORDER — CHLORDIAZEPOXIDE HCL 25 MG PO CAPS: 25.0000 mg | ORAL_CAPSULE | Freq: Three times a day (TID) | ORAL | Status: DC

## 2019-08-03 NOTE — ED Notes (Signed)
Pt pulled out iv, in her left AC. Pt states that she needed to go to the bathroom and no one helped her. New iv placed in right AC.

## 2019-08-03 NOTE — H&P (Signed)
Date: 08/03/2019               Patient Name:  Angela Palmer MRN: [id number]  DOB: [birth year] Age / Sex: 34 y.o., female   PCP: Patient, No Pcp Per         Medical Service: Internal Medicine Teaching Service         Attending Physician: Dr. Heide Spark    First Contact: Dr. Ronelle Nigh Pager: [phone]  Second Contact: Dr. Nedra Hai Pager: [phone]       After Hours (After 5p/  First Contact Pager: [phone]  weekends / holidays): Second Contact Pager: [phone]   Chief Complaint: alcohol intoxication  History of Present Illness: Angela Palmer has PMHx significant for alcohol use disorder and alcohol withdrawal seizures who presented to the hospital due to alcohol intoxication with a desire to stop drinking. She states that she is interested in checking into a rehab center which requires 72 hrs of alcohol cessation. When she tries to stop drinking on her own she has seizures.  She reports that her last drink was this morning at 10 AM at which time she drank around 4 shots of whiskey. She drinks around 4 shots of whiskey daily. On ROS she reports some feelings of fevers/chills when she attempts to stop drinking as well as nausea and vomiting which is worse when she's trying to stop drinking but also occurs with intoxication. She denies chest pain, SOB and frank hematemesis. She reports recent dysuria with urgency and an episode of urinary incontinence. She also reports a recent fall with 4 broken ribs on the right side. She endorses vivid dreams and increased alertness. She reports increased anxiety and depression but denies SI or HI. She has no other complaints or concerns.    Meds:  Current Meds  Medication Sig  . folic acid (FOLVITE) 1 MG tablet Take 1 tablet (1 mg total) by mouth daily.  Marland Kitchen levonorgestrel-ethinyl estradiol (ALESSE) 0.1-20 MG-MCG tablet Take 1 tablet by mouth daily.   Allergies: Allergies as of 08/03/2019 - Review Complete 08/03/2019  Allergen Reaction Noted  . Minocycline  Swelling 09/26/2017   Past Medical History:  Diagnosis Date  . Alcohol abuse   . Seizures (HCC)    alcohol withdrawal seizures   Family History:  Family History  Problem Relation Age of Onset  . Hypertension Mother   alcoholism  Social History:  Social History   Tobacco Use  . Smoking status: Never Smoker  . Smokeless tobacco: Never Used  Substance Use Topics  . Alcohol use: Yes    Alcohol/week: 0.0 standard drinks    Comment: daily use-1/2-3/4 fifth of vodka   . Drug use: No   -denies other substance use including marijuana, benzos, cocaine, opioids -lives at home with 3 men, feels safe at home  Review of Systems: A complete ROS was negative except as per HPI.   Physical Exam: Blood pressure 126/90, pulse (!) 116, temperature 98.3 F (36.8 C), temperature source Oral, resp. rate 17, height 5\' 5"  (1.651 m), weight 55.3 kg, SpO2 97 %.  Physical Exam  Constitutional: No distress.  Young female laying prone on the bed  Cardiovascular: Regular rhythm, normal heart sounds and intact distal pulses.  No murmur heard. tacy  Pulmonary/Chest: Effort normal and breath sounds normal. No respiratory distress. She exhibits tenderness (right rib cage bony tenderness).  Abdominal: Soft. Bowel sounds are normal. She exhibits no distension. There is no abdominal tenderness.  No suprapubic tenderness  Musculoskeletal:  General: Normal range of motion.  Neurological: She is alert.  Skin: Skin is warm and dry. She is not diaphoretic. No erythema.  Nursing note and vitals reviewed.  Labs: Results for orders placed or performed during the hospital encounter of 08/03/19 (from the past 24 hour(s))  Comprehensive metabolic panel     Status: Abnormal   Collection Time: 08/03/19  4:58 PM  Result Value Ref Range   Sodium 137 135 - 145 mmol/L   Potassium 3.3 (L) 3.5 - 5.1 mmol/L   Chloride 93 (L) 98 - 111 mmol/L   CO2 20 (L) 22 - 32 mmol/L   Glucose, Bld 81 70 - 99 mg/dL   BUN 7  6 - 20 mg/dL   Creatinine, Ser 0.68 0.44 - 1.00 mg/dL   Calcium 8.9 8.9 - 10.3 mg/dL   Total Protein 7.9 6.5 - 8.1 g/dL   Albumin 4.5 3.5 - 5.0 g/dL   AST 253 (H) 15 - 41 U/L   ALT 85 (H) 0 - 44 U/L   Alkaline Phosphatase 123 38 - 126 U/L   Total Bilirubin 1.4 (H) 0.3 - 1.2 mg/dL   GFR calc non Af Amer >60 >60 mL/min   GFR calc Af Amer >60 >60 mL/min   Anion gap 24 (H) 5 - 15  Ethanol     Status: Abnormal   Collection Time: 08/03/19  4:58 PM  Result Value Ref Range   Alcohol, Ethyl (B) 377 (HH) <10 mg/dL  cbc     Status: Abnormal   Collection Time: 08/03/19  4:58 PM  Result Value Ref Range   WBC 3.6 (L) 4.0 - 10.5 K/uL   RBC 4.66 3.87 - 5.11 MIL/uL   Hemoglobin 14.8 12.0 - 15.0 g/dL   HCT 44.3 36.0 - 46.0 %   MCV 95.1 80.0 - 100.0 fL   MCH 31.8 26.0 - 34.0 pg   MCHC 33.4 30.0 - 36.0 g/dL   RDW 14.8 11.5 - 15.5 %   Platelets 154 150 - 400 K/uL   nRBC 0.0 0.0 - 0.2 %  I-Stat beta hCG blood, ED     Status: None   Collection Time: 08/03/19  5:07 PM  Result Value Ref Range   I-stat hCG, quantitative <5.0 <5 mIU/mL   Comment 3          Rapid urine drug screen (hospital performed)     Status: Abnormal   Collection Time: 08/03/19  5:58 PM  Result Value Ref Range   Opiates NONE DETECTED NONE DETECTED   Cocaine NONE DETECTED NONE DETECTED   Benzodiazepines POSITIVE (A) NONE DETECTED   Amphetamines NONE DETECTED NONE DETECTED   Tetrahydrocannabinol NONE DETECTED NONE DETECTED   Barbiturates NONE DETECTED NONE DETECTED  Basic metabolic panel     Status: Abnormal   Collection Time: 08/03/19  9:00 PM  Result Value Ref Range   Sodium 136 135 - 145 mmol/L   Potassium 3.0 (L) 3.5 - 5.1 mmol/L   Chloride 98 98 - 111 mmol/L   CO2 21 (L) 22 - 32 mmol/L   Glucose, Bld 154 (H) 70 - 99 mg/dL   BUN 6 6 - 20 mg/dL   Creatinine, Ser 0.61 0.44 - 1.00 mg/dL   Calcium 7.8 (L) 8.9 - 10.3 mg/dL   GFR calc non Af Amer >60 >60 mL/min   GFR calc Af Amer >60 >60 mL/min   Anion gap 17 (H) 5 - 15     EKG: personally reviewed my interpretation is NSR.  No results found.  Assessment:  Angela Palmer is a 34 yo F w/ a PMHx of alcohol use with a hx of withdrawal seizures here with acute intoxication.  Plan by Problem:  Acute Intoxication: -pt with hx of daily excessive alcohol use, most recently today, presenting with a desire to stop drinking in setting of acute intoxication  -she reports drinking four shots today which is her normal daily amount -she states she wants to stop drinking but whenever she tries to do so on her own she has withdrawal seizures -ethanol level 377 -UDS positive for benzos which she received in the ED but no other substances -labs significant for gap acidosis, hypokalemia and elevated liver enzymes with a pattern consistent with alcohol use, has had hepatitis work up w/ HIV testing last month which was all negative -she is tachycardic on exam   Plan: -admit to obs -progressive care -telemetry -CIWA -librium taper  -multivit -thiamine  -replete K -check mag and phos -consult to [facility] for substance use counseling/education -fu CMP in AM  Dysuria: -on ROS patient reports recent onset dysuria with associated urgency and an episode of urinary incontinence; she thinks she may have a UTI -no suprapubic tenderness on exam  Plan: -UA w/ reflex cx  Principal Problem:   Acute alcohol intoxication (HCC) Active Problems:   Hypokalemia   High anion gap metabolic acidosis   Abnormal liver enzymes  Dispo: Admit patient to Observation with expected length of stay less than 2 midnights.  Signed: Jenell Milliner, MD 08/03/2019, 10:36 PM  Pager: [phone]

## 2019-08-03 NOTE — ED Triage Notes (Addendum)
Pt arrives to ED from home with complaints of wanting to stop drinking and enter a rehab facility. Patient states she needs to be clean for 72 hours, have blood work drawn, and a physical to be accepted. Patient endorses N/V, headache, and hallucinations. Patients last drink was this morning at 10am.

## 2019-08-03 NOTE — ED Provider Notes (Addendum)
[facility] EMERGENCY DEPARTMENT Provider Note   CSN: [id number] Arrival date & time: 08/03/19  1631     History Chief Complaint  Patient presents with  . Withdrawal    Angela Palmer is a 34 y.o. female history of seizures from alcohol withdrawal, alcohol abuse here presenting with wanting detox.  Patient states that she was admitted for rib fractures about a month ago.  Patient continues to drink alcohol every day.  Last use was 10 AM this morning.  Patient has some nausea vomiting and headaches.  She states that she needs detox from alcohol.  She called a couple centers apparently she needs to be sober for 72 hours before she can get into alcohol rehab center   The history is provided by the patient.       Past Medical History:  Diagnosis Date  . Alcohol abuse   . Seizures (HCC)    alcohol withdrawal seizures    Patient Active Problem List   Diagnosis Date Noted  . Acute alcohol intoxication (HCC) 08/03/2019  . High anion gap metabolic acidosis 08/03/2019  . Abnormal liver enzymes 08/03/2019  . Fracture of multiple ribs 06/27/2019  . Alcohol withdrawal syndrome with complication (HCC) 06/26/2019  . Hypomagnesemia 09/27/2017  . Seizure (HCC) 05/07/2016  . Alcohol dependence with withdrawal with complication (HCC) 05/07/2016  . Hypokalemia 05/07/2016  . Transaminitis 05/07/2016  . Metabolic acidosis 05/07/2016    History reviewed. No pertinent surgical history.   OB History   No obstetric history on file.     Family History  Problem Relation Age of Onset  . Hypertension Mother     Social History   Tobacco Use  . Smoking status: Never Smoker  . Smokeless tobacco: Never Used  Substance Use Topics  . Alcohol use: Yes    Alcohol/week: 0.0 standard drinks    Comment: daily use-1/2-3/4 fifth of vodka   . Drug use: No    Home Medications Prior to Admission medications   Medication Sig Start Date End Date Taking? Authorizing Provider   folic acid (FOLVITE) 1 MG tablet Take 1 tablet (1 mg total) by mouth daily. 06/28/19  Yes Christian, Rylee, MD  levonorgestrel-ethinyl estradiol (ALESSE) 0.1-20 MG-MCG tablet Take 1 tablet by mouth daily. 06/28/19  Yes Christian, Rylee, MD  Multiple Vitamin (MULTIVITAMIN WITH MINERALS) TABS tablet Take 1 tablet by mouth daily. Patient not taking: Reported on 08/03/2019 06/28/19   Elige Radon, MD  naltrexone (DEPADE) 50 MG tablet Take 1 tablet (50 mg total) by mouth daily. Patient not taking: Reported on 08/03/2019 06/27/19   Elige Radon, MD  thiamine 100 MG tablet Take 1 tablet (100 mg total) by mouth daily. Patient not taking: Reported on 08/03/2019 06/28/19   Elige Radon, MD    Allergies    Minocycline  Review of Systems   Review of Systems  Psychiatric/Behavioral: The patient is nervous/anxious.        Tremors   All other systems reviewed and are negative.   Physical Exam Updated Vital Signs BP 126/90   Pulse (!) 116   Temp 98.3 F (36.8 C) (Oral)   Resp 17   Ht 5\' 5"  (1.651 m)   Wt 55.3 kg   SpO2 97%   BMI 20.30 kg/m   Physical Exam Vitals and nursing note reviewed.  Constitutional:      Comments: Anxious, intoxicated   HENT:     Head: Normocephalic.     Nose: Nose normal.  Mouth/Throat:     Mouth: Mucous membranes are dry.  Eyes:     Extraocular Movements: Extraocular movements intact.     Pupils: Pupils are equal, round, and reactive to light.  Cardiovascular:     Rate and Rhythm: Regular rhythm. Tachycardia present.     Pulses: Normal pulses.     Heart sounds: Normal heart sounds.  Pulmonary:     Effort: Pulmonary effort is normal.     Breath sounds: Normal breath sounds.  Abdominal:     General: Abdomen is flat.     Palpations: Abdomen is soft.  Musculoskeletal:        General: Normal range of motion.     Cervical back: Normal range of motion.  Skin:    General: Skin is warm.     Capillary Refill: Capillary refill takes less than 2  seconds.  Neurological:     General: No focal deficit present.     Mental Status: She is oriented to person, place, and time.     Cranial Nerves: No cranial nerve deficit.     Sensory: No sensory deficit.  Psychiatric:     Comments: Anxious      ED Results / Procedures / Treatments   Labs (all labs ordered are listed, but only abnormal results are displayed) Labs Reviewed  COMPREHENSIVE METABOLIC PANEL - Abnormal; Notable for the following components:      Result Value   Potassium 3.3 (*)    Chloride 93 (*)    CO2 20 (*)    AST 253 (*)    ALT 85 (*)    Total Bilirubin 1.4 (*)    Anion gap 24 (*)    All other components within normal limits  ETHANOL - Abnormal; Notable for the following components:   Alcohol, Ethyl (B) 377 (*)    All other components within normal limits  CBC - Abnormal; Notable for the following components:   WBC 3.6 (*)    All other components within normal limits  RAPID URINE DRUG SCREEN, HOSP PERFORMED - Abnormal; Notable for the following components:   Benzodiazepines POSITIVE (*)    All other components within normal limits  BASIC METABOLIC PANEL - Abnormal; Notable for the following components:   Potassium 3.0 (*)    CO2 21 (*)    Glucose, Bld 154 (*)    Calcium 7.8 (*)    Anion gap 17 (*)    All other components within normal limits  SARS CORONAVIRUS 2 (TAT 6-24 HRS)  URINALYSIS, ROUTINE W REFLEX MICROSCOPIC  COMPREHENSIVE METABOLIC PANEL  CBC  PHOSPHORUS  MAGNESIUM  PHOSPHORUS  MAGNESIUM  I-STAT BETA HCG BLOOD, ED ([facility], [facility], [facility] ONLY)    EKG None  Radiology No results found.  Procedures Procedures (including critical care time)  CRITICAL CARE Performed by: Wandra Arthurs   Total critical care time: 30 minutes  Critical care time was exclusive of separately billable procedures and treating other patients.  Critical care was necessary to treat or prevent imminent or life-threatening deterioration.  Critical care was time spent  personally by me on the following activities: development of treatment plan with patient and/or surrogate as well as nursing, discussions with consultants, evaluation of patient's response to treatment, examination of patient, obtaining history from patient or surrogate, ordering and performing treatments and interventions, ordering and review of laboratory studies, ordering and review of radiographic studies, pulse oximetry and re-evaluation of patient's condition.   Medications Ordered in ED Medications  enoxaparin (LOVENOX) injection  40 mg (has no administration in time range)  multivitamin with minerals tablet 1 tablet (has no administration in time range)  thiamine (B-1) injection 100 mg (has no administration in time range)  thiamine tablet 100 mg (has no administration in time range)  chlordiazePOXIDE (LIBRIUM) capsule 25 mg (has no administration in time range)    Followed by  chlordiazePOXIDE (LIBRIUM) capsule 25 mg (has no administration in time range)    Followed by  chlordiazePOXIDE (LIBRIUM) capsule 25 mg (has no administration in time range)    Followed by  chlordiazePOXIDE (LIBRIUM) capsule 25 mg (has no administration in time range)  sodium chloride 0.9 % bolus 1,000 mL (0 mLs Intravenous Stopped 08/03/19 2059)  LORazepam (ATIVAN) injection 2 mg (2 mg Intravenous Given 08/03/19 1837)  sodium chloride 0.9 % bolus 1,000 mL (0 mLs Intravenous Stopped 08/03/19 1928)  sodium chloride 0.9 % bolus 1,000 mL (1,000 mLs Intravenous New Bag/Given 08/03/19 2059)  potassium chloride SA (KLOR-CON) CR tablet 40 mEq (40 mEq Oral Given 08/03/19 2150)    ED Course  I have reviewed the triage vital signs and the nursing notes.  Pertinent labs & imaging results that were available during my care of the patient were reviewed by me and considered in my medical decision making (see chart for details).    MDM Rules/Calculators/A&P                      Shery Wauneka is a 34 y.o. female is here  with needing alcohol detox.  However patient appears intoxicated.  Patient is also tachycardic at bedside.  Will get chemistry and alcohol level.  Will hydrate patient  Additional history obtained:  Previous records obtained and reviewed from recent hospitalization   Lab Tests:  I Ordered, reviewed, and interpreted labs, which included:  CBC, CMP- AG 26, ETOH- 370    Medicines ordered:  I ordered medication IVF, ativan  For dehydration, alcohol withdrawal   10:10 PM Patient CIWA is 17 initially.  Her heart rate was initially about 110.  Heart rate after 2 L bolus went up to 125 .  Her alcohol level is 377 .  Her anion gap is 24. I think her anion gap acidosis is likely secondary to dehydration and alcohol use.  She has no fever or vomiting.  She also denies any other ingestions. At this point, will admit for alcohol withdrawal, anion gap acidosis.     Final Clinical Impression(s) / ED Diagnoses Final diagnoses:  Alcohol abuse  Metabolic acidosis, increased anion gap    Rx / DC Orders ED Discharge Orders    None       Charlynne Pander, MD 08/03/19 2115    Charlynne Pander, MD 08/03/19 2210

## 2019-08-04 ENCOUNTER — Telehealth: Payer: Self-pay

## 2019-08-04 DIAGNOSIS — Y908 Blood alcohol level of 240 mg/100 ml or more: Secondary | ICD-10-CM

## 2019-08-04 DIAGNOSIS — F1010 Alcohol abuse, uncomplicated: Secondary | ICD-10-CM | POA: Insufficient documentation

## 2019-08-04 DIAGNOSIS — R748 Abnormal levels of other serum enzymes: Secondary | ICD-10-CM

## 2019-08-04 DIAGNOSIS — K709 Alcoholic liver disease, unspecified: Secondary | ICD-10-CM

## 2019-08-04 DIAGNOSIS — F10129 Alcohol abuse with intoxication, unspecified: Principal | ICD-10-CM

## 2019-08-04 DIAGNOSIS — E876 Hypokalemia: Secondary | ICD-10-CM

## 2019-08-04 DIAGNOSIS — E872 Acidosis: Secondary | ICD-10-CM

## 2019-08-04 LAB — COMPREHENSIVE METABOLIC PANEL
ALT: 60 U/L — ABNORMAL HIGH (ref 0–44)
AST: 160 U/L — ABNORMAL HIGH (ref 15–41)
Albumin: 3.5 g/dL (ref 3.5–5.0)
Alkaline Phosphatase: 88 U/L (ref 38–126)
Anion gap: 16 — ABNORMAL HIGH (ref 5–15)
BUN: <5 mg/dL — ABNORMAL LOW (ref 6–20)
CO2: 21 mmol/L — ABNORMAL LOW (ref 22–32)
Calcium: 8.2 mg/dL — ABNORMAL LOW (ref 8.9–10.3)
Chloride: 99 mmol/L (ref 98–111)
Creatinine, Ser: 0.45 mg/dL (ref 0.44–1.00)
GFR calc Af Amer: >60 mL/min (ref >60)
GFR calc non Af Amer: >60 mL/min (ref >60)
Glucose, Bld: 103 mg/dL — ABNORMAL HIGH (ref 70–99)
Potassium: 3.5 mmol/L (ref 3.5–5.1)
Sodium: 136 mmol/L (ref 135–145)
Total Bilirubin: 0.8 mg/dL (ref 0.3–1.2)
Total Protein: 6.4 g/dL — ABNORMAL LOW (ref 6.5–8.1)

## 2019-08-04 LAB — CBC
HCT: 39 % (ref 36.0–46.0)
Hemoglobin: 13 g/dL (ref 12.0–15.0)
MCH: 31.9 pg (ref 26.0–34.0)
MCHC: 33.3 g/dL (ref 30.0–36.0)
MCV: 95.8 fL (ref 80.0–100.0)
Platelets: 118 10*3/uL — ABNORMAL LOW (ref 150–400)
RBC: 4.07 MIL/uL (ref 3.87–5.11)
RDW: 14.9 % (ref 11.5–15.5)
WBC: 2.5 10*3/uL — ABNORMAL LOW (ref 4.0–10.5)
nRBC: 0 % (ref 0.0–0.2)

## 2019-08-04 LAB — MAGNESIUM: Magnesium: 1.2 mg/dL — ABNORMAL LOW (ref 1.7–2.4)

## 2019-08-04 LAB — PHOSPHORUS: Phosphorus: 2.4 mg/dL — ABNORMAL LOW (ref 2.5–4.6)

## 2019-08-04 LAB — SARS CORONAVIRUS 2 (TAT 6-24 HRS): SARS Coronavirus 2: NEGATIVE

## 2019-08-04 MED ORDER — MAGNESIUM SULFATE 2 GM/50ML IV SOLN
2.0000 g | Freq: Once | INTRAVENOUS | Status: AC
  Administered 2019-08-04: 07:00:00 2 g via INTRAVENOUS
  Filled 2019-08-04: qty 50

## 2019-08-04 MED ORDER — POTASSIUM CHLORIDE CRYS ER 20 MEQ PO TBCR
40.0000 meq | EXTENDED_RELEASE_TABLET | Freq: Once | ORAL | Status: AC
  Administered 2019-08-04: 40 meq via ORAL
  Filled 2019-08-04: qty 2

## 2019-08-04 MED ORDER — K PHOS MONO-SOD PHOS DI & MONO 155-852-130 MG PO TABS
250.0000 mg | ORAL_TABLET | Freq: Two times a day (BID) | ORAL | Status: DC
  Administered 2019-08-04: 250 mg via ORAL
  Filled 2019-08-04 (×2): qty 1

## 2019-08-04 MED ORDER — THIAMINE HCL 100 MG PO TABS: 100.0000 mg | ORAL_TABLET | Freq: Every day | ORAL | Status: DC

## 2019-08-04 MED ORDER — LORAZEPAM 2 MG/ML IJ SOLN
1.0000 mg | Freq: Once | INTRAMUSCULAR | Status: AC
  Administered 2019-08-04: 07:00:00 1 mg via INTRAVENOUS
  Filled 2019-08-04: qty 1

## 2019-08-04 MED ORDER — K PHOS MONO-SOD PHOS DI & MONO 155-852-130 MG PO TABS: 250.0000 mg | ORAL_TABLET | Freq: Two times a day (BID) | ORAL | 0 refills | Status: AC

## 2019-08-04 MED ORDER — CHLORDIAZEPOXIDE HCL 25 MG PO CAPS: ORAL_CAPSULE | ORAL | 0 refills | Status: DC

## 2019-08-04 MED FILL — PHOSPHA 250 NEUTRAL TABLET: 155-852-130 | 3 days supply | Qty: 6 | Fill #0

## 2019-08-04 MED FILL — CHLORDIAZEPOXIDE 25 MG CAP: 25 | 4 days supply | Qty: 8 | Fill #0

## 2019-08-04 NOTE — Social Work (Signed)
CSW met with pt at bedside. CSW introduced self and explained her role at the hospital. CSW completed SBIRT. Pt scored a 32 on the sbirt scale. Pt states she drinks whisky daily, pt was unsure about how much but stated its a lot. Pt stated she drinks through out her whole day. Pt states she knows she has a problem but doesn't know how to stop without withdrawals.   CSW and pt discussed the effects of alcohol on the body and overall health. CSW offered resources. The pt was receptive. Pt stated she needs a residential facility. Pt was interested in [facility] residential recovery program. CSW encouraged pt to call to check requirements and bed availability. CSW offered to assist pt with the call and the pt declined. Pt stated she needs to wait til she gets home and gets herself together first. CSW encouraged pt to do it soon. CSW also reviewed outpatient programs if pt chooses not to go to residential.   Emeterio Reeve, [place], [place] Social Worker [phone]

## 2019-08-04 NOTE — ED Notes (Signed)
Pt discharge instructions and prescriptions reviewed with the patient. The patient verbalized understanding of both. Pt discharged. 

## 2019-08-04 NOTE — Progress Notes (Addendum)
  Subjective:  Patient seen at bedside. Patient reports that she is tolerating PO intake, reports mild withdrawal symptoms. States she has previously had withdrawal seizures. Patient states she was previously treated outpatient with ativan but has never been on librium.  Objective:   Vital Signs (last 24 hours): Vitals:   08/03/19 2230 08/04/19 0124 08/04/19 0454 08/04/19 0504  BP: 130/83 101/69 135/86 135/86  Pulse: (!) 131 100 (!) 108 (!) 108  Resp: 17 15  17   Temp:      TempSrc:      SpO2: 99% 99%  98%  Weight:      Height:       Physical Exam: General Alert and answers questions appropriately, no acute distress  Cardiac Regular rate and rhythm, no murmurs, rubs, or gallops  Pulmonary Clear to auscultation bilaterally without wheezes, rhonchi, or rales   CMP Latest Ref Rng & Units 08/04/2019 08/03/2019 08/03/2019  Glucose 70 - 99 mg/dL 08/05/2019) 825(K) 81  BUN 6 - 20 mg/dL 539(J) 6 7  Creatinine <6(B - 1.00 mg/dL 3.41 9.37 9.02  Sodium 135 - 145 mmol/L 136 136 137  Potassium 3.5 - 5.1 mmol/L 3.5 3.0(L) 3.3(L)  Chloride 98 - 111 mmol/L 99 98 93(L)  CO2 22 - 32 mmol/L 21(L) 21(L) 20(L)  Calcium 8.9 - 10.3 mg/dL 8.2(L) 7.8(L) 8.9  Total Protein 6.5 - 8.1 g/dL 6.4(L) - 7.9  Total Bilirubin 0.3 - 1.2 mg/dL 0.8 - 4.09)  Alkaline Phos 38 - 126 U/L 88 - 123  AST 15 - 41 U/L 160(H) - 253(H)  ALT 0 - 44 U/L 60(H) - 85(H)    Assessment/Plan:   Principal Problem:   Acute alcohol intoxication (HCC) Active Problems:   Hypokalemia   High anion gap metabolic acidosis   Abnormal liver enzymes  Patient is a 34 year old female with a past medical history of alcohol use disorder with history of withdrawal seizures who presented on 08/03/19 in setting of acute alcohol intoxication.  # Alcohol use disorder Patient presented in acute alcohol intoxication with alcohol of 377 mg/dL, last drink day of presentation.  Laboratory studies were significant for anion gap acidosis (24), hypokalemia  (3.3), and elevated liver enzymes consistent with alcoholic liver disease (AST, ALT of 253, 85).  Patient was provided supportive care, electrolytes repleted, provided vitamin supplementation and 1 L NS bolus x3.  *Started on librium 25 mg taper with daily dose reduction - QID, TID, BID, QD *Thiamine 100 mg daily+Multivitamin daily *CIWA protocol without ativan *Cardiac monitoring  PT/OT: Consult not indicated TOC: Consulted for cessation resources Diet: Regular DVT Ppx: Lovenox 40 mg daily Admit Status: Observation Dispo: Anticipated discharge today pending TOC evaluation  08/05/19, MD 08/04/2019, 9:51 AM

## 2019-08-04 NOTE — ED Notes (Signed)
Lunch Tray Ordered @ 1047. 

## 2019-08-04 NOTE — Telephone Encounter (Signed)
[facility] appt 08/09/2019.

## 2019-08-04 NOTE — ED Notes (Signed)
SDU  bfast ordered  

## 2019-08-04 NOTE — Discharge Summary (Signed)
Name: Angela Palmer MRN: [id number] DOB: January 28, 1986 34 y.o. PCP: Patient, No Pcp Per  Date of Admission: 08/03/2019  4:33 PM Date of Discharge: 08/04/19 Attending Physician: Sid Falcon, MD  Discharge Diagnosis: 1. Alcohol use disorder 2. Alcohol intoxication 3. Alcoholic liver disease  Discharge Medications: Allergies as of 08/04/2019      Reactions   Minocycline Swelling   Swelling of the face       Medication List    STOP taking these medications   multivitamin with minerals Tabs tablet   naltrexone 50 MG tablet Commonly known as: DEPADE   thiamine 100 MG tablet     TAKE these medications   chlordiazePOXIDE 25 MG capsule Commonly known as: LIBRIUM Take 1 caplet (25 mg) twice today (6pm, 10 pm), 3 times on Saturday (8am,12pm,5pm), twice on Sunday (8am, 5pm) and then once (8am) on Monday   folic acid 1 MG tablet Commonly known as: FOLVITE Take 1 tablet (1 mg total) by mouth daily.   levonorgestrel-ethinyl estradiol 0.1-20 MG-MCG tablet Commonly known as: ALESSE Take 1 tablet by mouth daily.   phosphorus 155-852-130 MG tablet Commonly known as: K PHOS NEUTRAL Take 1 tablet (250 mg total) by mouth 2 (two) times daily for 3 days.       Disposition and follow-up:   [name] was discharged from [facility] in Stable condition.  At the hospital follow up visit please address:  1.  Please assess if patient has completed librium taper and been able to continue with alcohol cessation. Please assess if patient has pursued inpatient rehabilitation. Please see if patient is interested in Naltrexone for alcohol cessation aide. Please check electrolytes  2.  Labs / imaging needed at time of follow-up: BMP, Magnesium, Phosphorus  3.  Pending labs/ test needing follow-up: None  Follow-up Appointments: Follow-up Information    [place]. Call.   Why: Please go to your appointment on Wednesday 08/09/19 at  8:45 am Contact information: [address] [place] [facility] Course by problem list:  # Alcohol use disorder # Acute alcohol intoxication Patient is a 34 year old female with past medical history of alcohol use disorder, withdrawal seizures who presented on 08/03/2019 requesting detoxification in setting of acute alcohol intoxication with blood alcohol level of 377 mg/dL, last drink day of admission.  On presentation, laboratory studies were significant for anion gap metabolic acidosis (24), hypokalemia (3.3), and elevated liver enzymes consistent with alcoholic liver disease (AST, ALT of 253, 85).  Patient was provided supportive care, electrolytes repleted, provided vitamin supplementation and 1 L NS bolus x3.  On day following admission, patient mentation was within normal limits, laboratory studies with improvement in electrolytes, anion gap, LFTs.  Patient was discharged on Librium taper - librium 25 mg four times on 4/16, three times on 4/17, twice on 4/18, and once on 4/19.  Patient articulated interested in a residential rehabilitation facility, was provided resources by social work.  Patient expressed particular interest in [facility] residential recovery program.  Patient declined to call while hospitalized, but articulated that she would call when she returned home. Patient provided with followup appointment in [facility] clinic.  Discharge Vitals:   BP (!) 136/95   Pulse (!) 115   Temp 98.3 F (36.8 C) (Oral)   Resp 16   Ht 5\' 5"  (1.651 m)   Wt 55.3 kg   SpO2 99%  BMI 20.30 kg/m   Pertinent Labs, Studies, and Procedures:  CBC Latest Ref Rng & Units 08/04/2019 08/03/2019 06/26/2019  WBC 4.0 - 10.5 K/uL 2.5(L) 3.6(L) 8.3  Hemoglobin 12.0 - 15.0 g/dL 04.8 88.9 16.9  Hematocrit 36.0 - 46.0 % 39.0 44.3 39.9  Platelets 150 - 400 K/uL 118(L) 154 198   CMP Latest Ref Rng & Units 08/04/2019 08/03/2019 08/03/2019  Glucose 70 - 99 mg/dL 450(T) 888(K)  81  BUN 6 - 20 mg/dL <8(M) 6 7  Creatinine 0.34 - 1.00 mg/dL 9.17 9.15 0.56  Sodium 135 - 145 mmol/L 136 136 137  Potassium 3.5 - 5.1 mmol/L 3.5 3.0(L) 3.3(L)  Chloride 98 - 111 mmol/L 99 98 93(L)  CO2 22 - 32 mmol/L 21(L) 21(L) 20(L)  Calcium 8.9 - 10.3 mg/dL 8.2(L) 7.8(L) 8.9  Total Protein 6.5 - 8.1 g/dL 6.4(L) - 7.9  Total Bilirubin 0.3 - 1.2 mg/dL 0.8 - 9.7(X)  Alkaline Phos 38 - 126 U/L 88 - 123  AST 15 - 41 U/L 160(H) - 253(H)  ALT 0 - 44 U/L 60(H) - 85(H)   Phosphorus: 1.5 -> 2.4 Magnesium: 1.2  Discharge Instructions: Discharge Instructions    Call MD for:  persistant nausea and vomiting   Complete by: As directed    Call MD for:  severe uncontrolled pain   Complete by: As directed    Diet - low sodium heart healthy   Complete by: As directed    Discharge instructions   Complete by: As directed    You were seen in the hospital for detoxication from alcohol. You have been discharged with librium to help prevent severe withdrawal symptoms. You have been given librium twice today, so please take it two more times today (at 6 pm and 10 pm). Tomorrow, please take librium 3 times (8am, 12pm, and 5pm). On Sunday, please take librium twice (8am and 5pm). On Monday, take librium once in the morning.  We have scheduled you for a followup appointment in our clinic next Wednesday at 8:45 am, please see attached information on clinic location. If you get admitted to inpatient rehab or are for some other reason unable to make it to the appointment, please call to cancel or move the appointment.  If you develop uncontrolled withdrawal symptoms, please return to the emergency department  Thank you for allowing [place] to be part of your medical care!   Increase activity slowly   Complete by: As directed       Signed: Katherine Roan, MD 08/04/2019, 1:28 PM

## 2019-08-04 NOTE — Discharge Planning (Signed)
RNCM consulted regarding resources for rehab for this pt.  RNCM deferred to SW to assist with this matter.

## 2019-08-06 MED ORDER — BL TUSSIN CF 30-10-100 MG/5ML PO SYRP
2.00 | ORAL_SOLUTION | ORAL | Status: DC
Start: ? — End: 2019-08-06

## 2019-08-06 MED ORDER — SB BRONCHIAL 12.5-200 MG PO TABS
50.00 | ORAL_TABLET | ORAL | Status: DC
Start: ? — End: 2019-08-06

## 2019-08-06 MED ORDER — BELLADONNA ALKALOIDS-OPIUM RE
10.00 | RECTAL | Status: DC
Start: ? — End: 2019-08-06

## 2019-08-06 MED ORDER — PEDIASURE 1.0 CAL/FIBER PO LIQD
2.00 | ORAL | Status: DC
Start: ? — End: 2019-08-06

## 2019-08-06 MED ORDER — GENAPAP 325 MG PO TABS
2.00 | ORAL_TABLET | ORAL | Status: DC
Start: ? — End: 2019-08-06

## 2019-08-06 MED ORDER — Medication
600.00 | Status: DC
Start: ? — End: 2019-08-06

## 2019-08-06 MED ORDER — PYRILAMINE TAN-PHENYLEPH TAN 30-5 MG/5ML PO SUSP
50.00 | ORAL | Status: DC
Start: ? — End: 2019-08-06

## 2019-08-06 MED ORDER — PROMETHAZINE HCL (BULK CHEMICALS - P'S)
50.00 | Status: DC
Start: ? — End: 2019-08-06

## 2019-08-06 MED ORDER — ALMACONE II PO
5.00 | ORAL | Status: DC
Start: ? — End: 2019-08-06

## 2019-08-06 MED ORDER — PIPERACILLIN SODIUM
10.00 | Status: DC
Start: ? — End: 2019-08-06

## 2019-08-06 MED ORDER — NUTRINATE PO CHEW
2.00 | CHEWABLE_TABLET | ORAL | Status: DC
Start: ? — End: 2019-08-06

## 2019-08-06 MED ORDER — Medication
Status: DC
Start: ? — End: 2019-08-06

## 2019-08-06 MED ORDER — NUTRINATE PO CHEW
1.00 | CHEWABLE_TABLET | ORAL | Status: DC
Start: ? — End: 2019-08-06

## 2019-08-06 MED ORDER — MP TRI-FED COLD 2.5-60 MG PO TABS
50.00 | ORAL_TABLET | ORAL | Status: DC
Start: ? — End: 2019-08-06

## 2019-08-06 MED ORDER — ONE-A-DAY WITHIN PO
30.00 | ORAL | Status: DC
Start: ? — End: 2019-08-06

## 2019-08-06 MED ORDER — QUINERVA 260 MG PO TABS
650.00 | ORAL_TABLET | ORAL | Status: DC
Start: ? — End: 2019-08-06

## 2019-08-06 MED ORDER — NUTREN 1.0 PO
1.00 | ORAL | Status: DC
Start: ? — End: 2019-08-06

## 2019-08-06 MED ORDER — NEOMYCIN-POLYMYXIN-GRAMICIDIN 1.75-10000-.025 OP SOLN
8.00 | OPHTHALMIC | Status: DC
Start: ? — End: 2019-08-06

## 2019-08-06 MED ORDER — INTRON A 6000000 UNIT/ML IJ SOLN
1.00 | INTRAMUSCULAR | Status: DC
Start: ? — End: 2019-08-06

## 2019-08-06 MED ORDER — GUAIFENESIN 100 MG/5ML PO SYRP
200.00 | ORAL_SOLUTION | ORAL | Status: DC
Start: ? — End: 2019-08-06

## 2019-08-06 MED ORDER — QUINTABS PO TABS
1.00 | ORAL_TABLET | ORAL | Status: DC
Start: 2019-08-10 — End: 2019-08-06

## 2019-08-06 MED ORDER — Medication
5.00 | Status: DC
Start: ? — End: 2019-08-06

## 2019-08-06 MED ORDER — DOLACET PO
0.10 | ORAL | Status: DC
Start: ? — End: 2019-08-06

## 2019-08-06 MED ORDER — THIAMINE HCL 100 MG PO TABS
100.00 | ORAL_TABLET | ORAL | Status: DC
Start: 2019-08-06 — End: 2019-08-06

## 2019-08-07 DIAGNOSIS — F331 Major depressive disorder, recurrent, moderate: Secondary | ICD-10-CM | POA: Diagnosis present

## 2019-08-08 MED ORDER — ALBERTSONS BUFFERED ASPIRIN 325 MG PO TABS
50.00 | ORAL_TABLET | ORAL | Status: DC
Start: 2019-08-09 — End: 2019-08-08

## 2019-08-09 ENCOUNTER — Ambulatory Visit: Payer: Self-pay

## 2019-08-11 LAB — VITAMIN B1: Vitamin B1 (Thiamine): 105.7 nmol/L (ref 66.5–200.0)

## 2019-08-15 NOTE — Telephone Encounter (Signed)
Pt did not come to her appt on 4/21.

## 2020-06-08 IMAGING — US US ABDOMEN LIMITED
1 series · 14 of 25 positions shown · non-contrast
Comparison: None.

CLINICAL DATA: Abdominal pain with elevated liver enzymes

EXAM:
ULTRASOUND ABDOMEN LIMITED RIGHT UPPER QUADRANT

[Series 1: us abdomen limited · 14 of 46 slices shown]
[im 1/46]
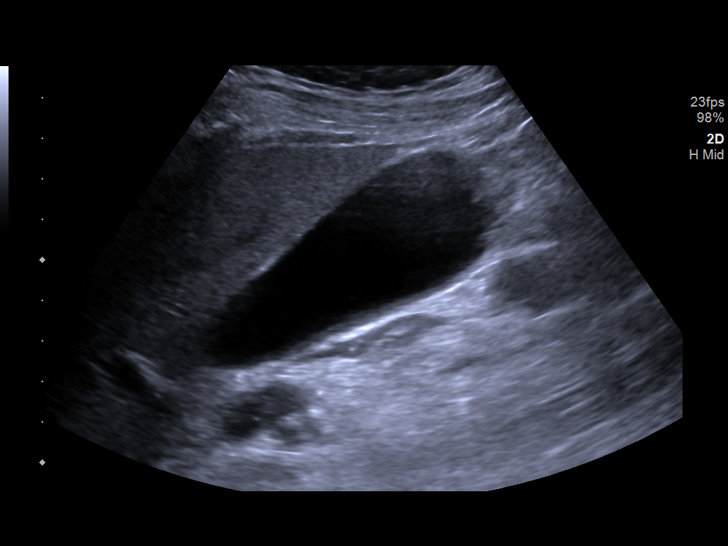
[im 4/46]
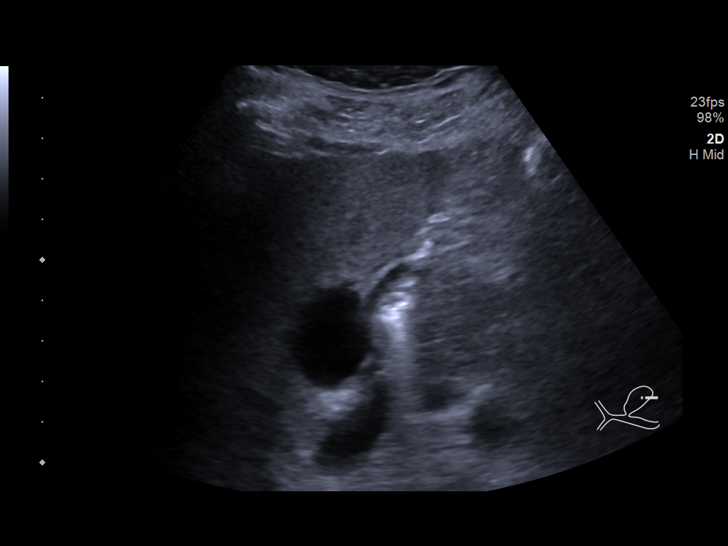
[im 8/46]
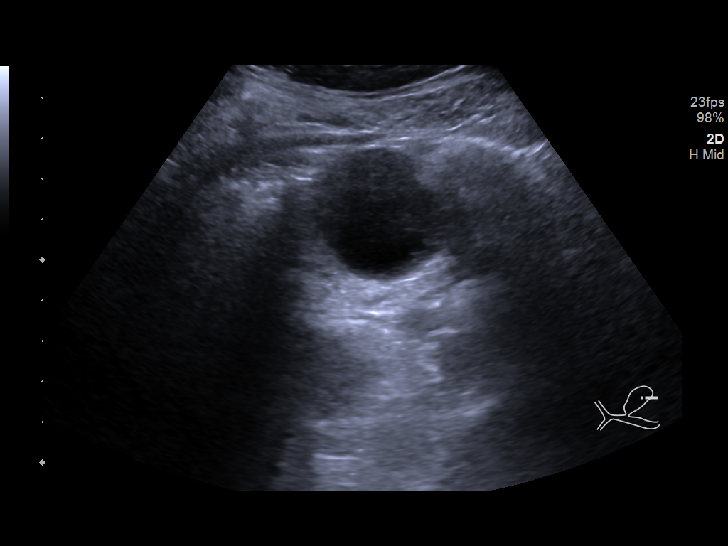
[im 12/46]
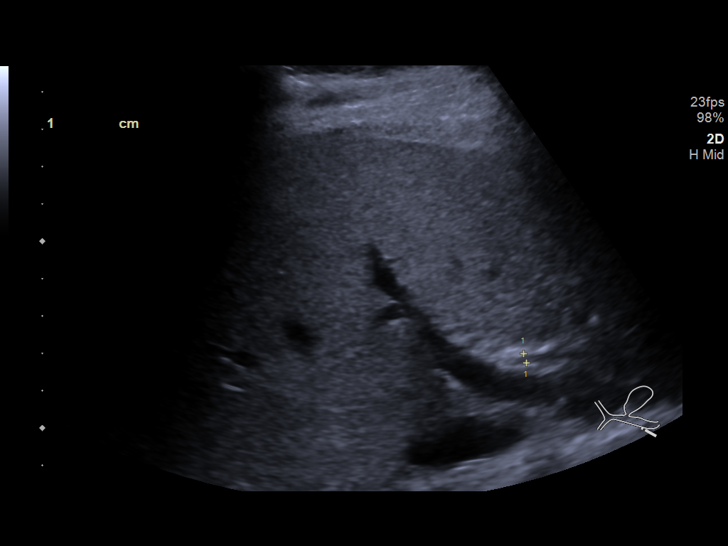
[im 16/46]
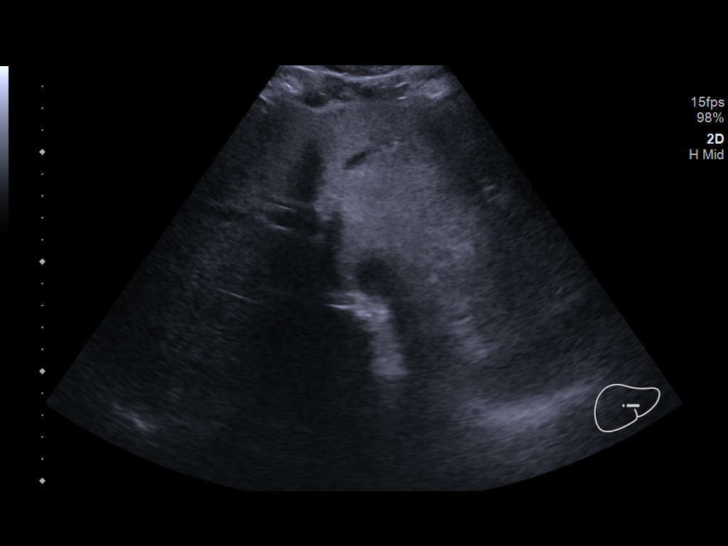
[im 17/46]
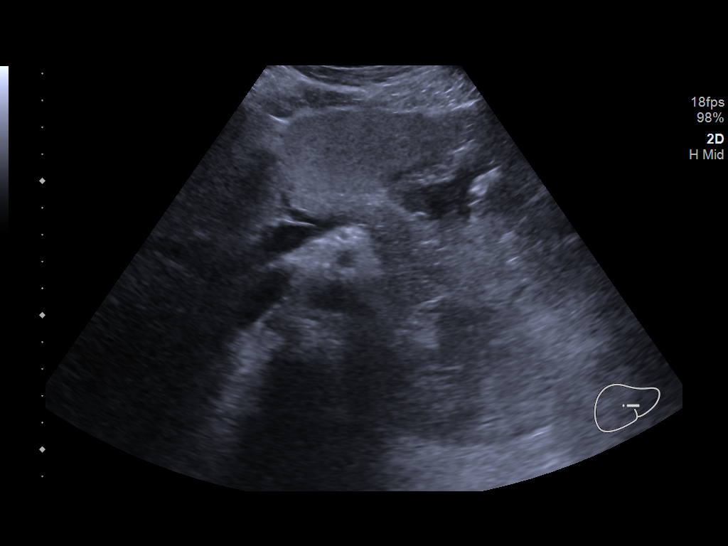
[im 21/46]
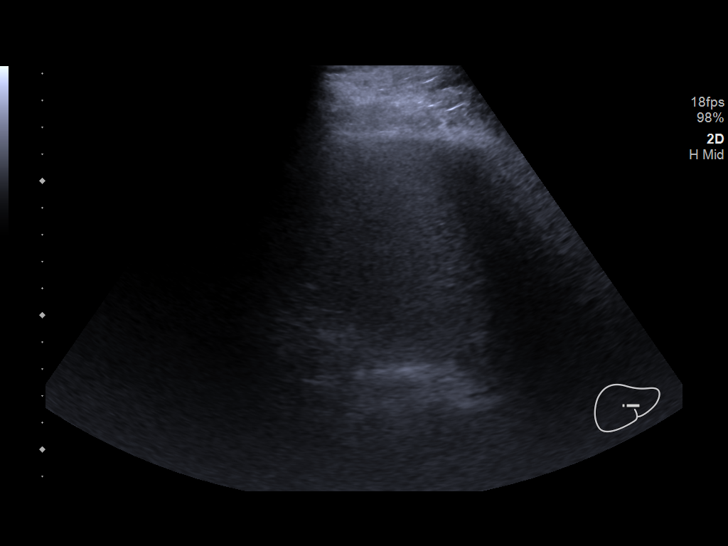
[im 25/46]
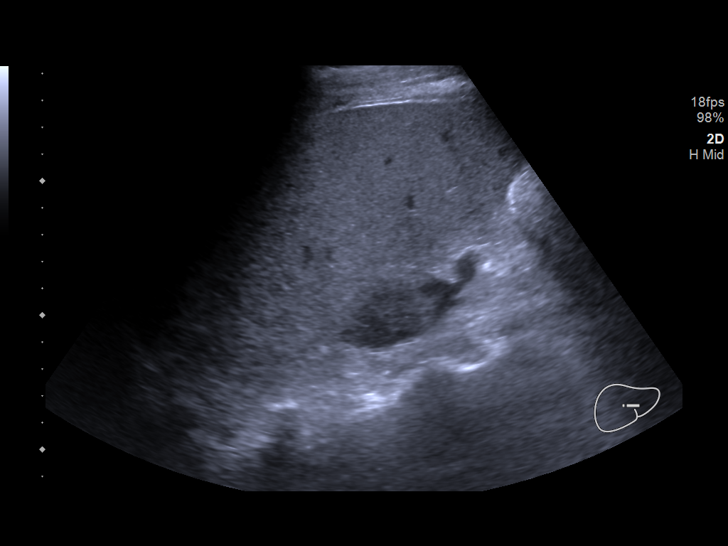
[im 29/46]
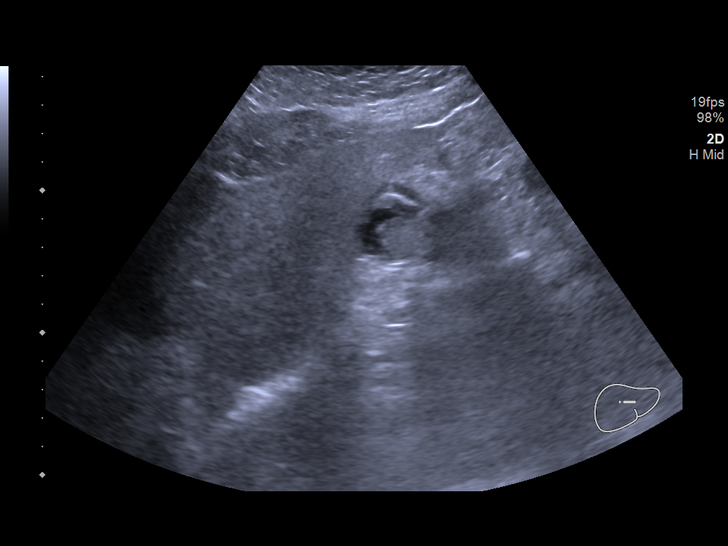
[im 31/46]
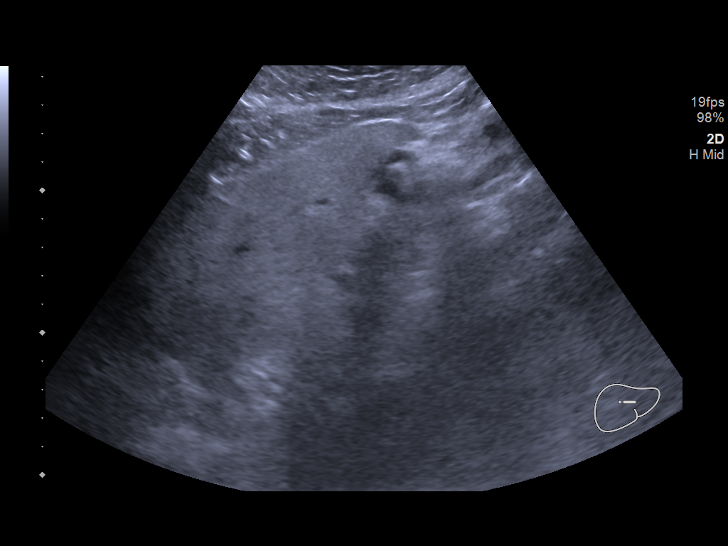
[im 34/46]
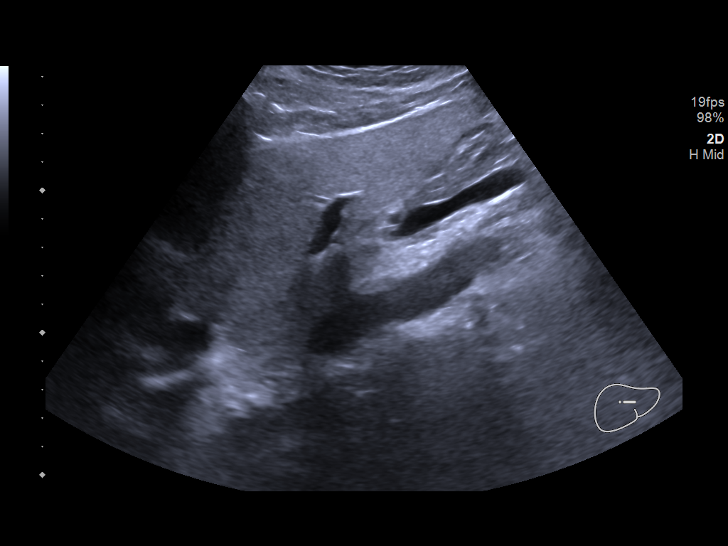
[im 38/46]
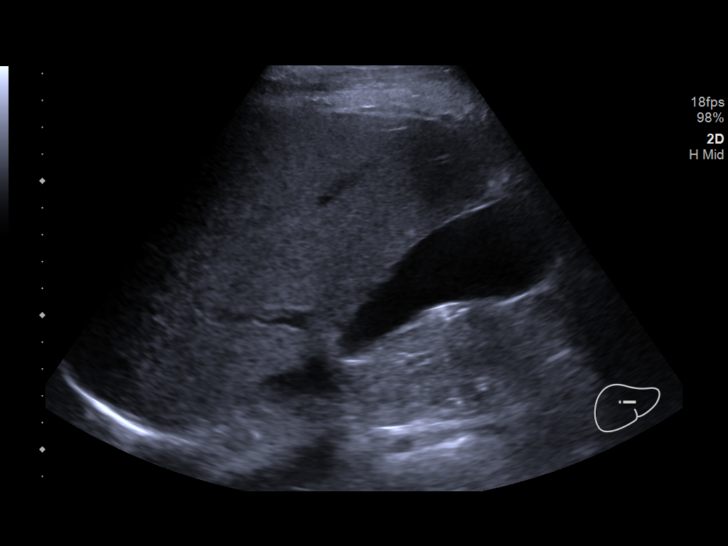
[im 42/46]
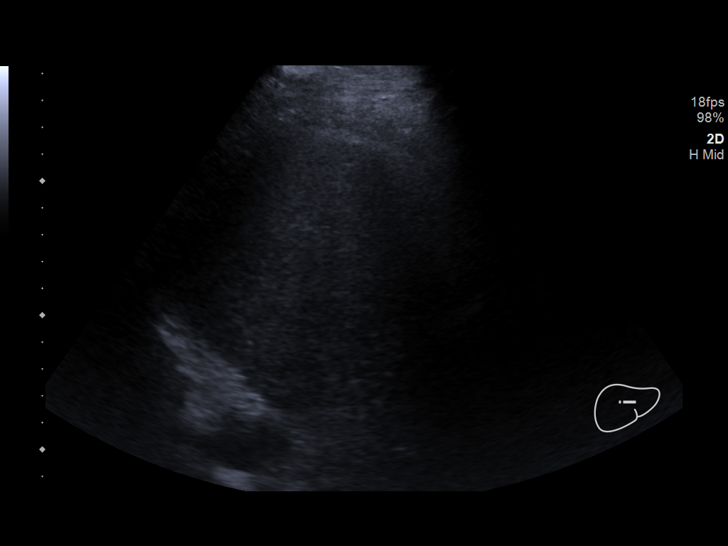
[im 46/46]
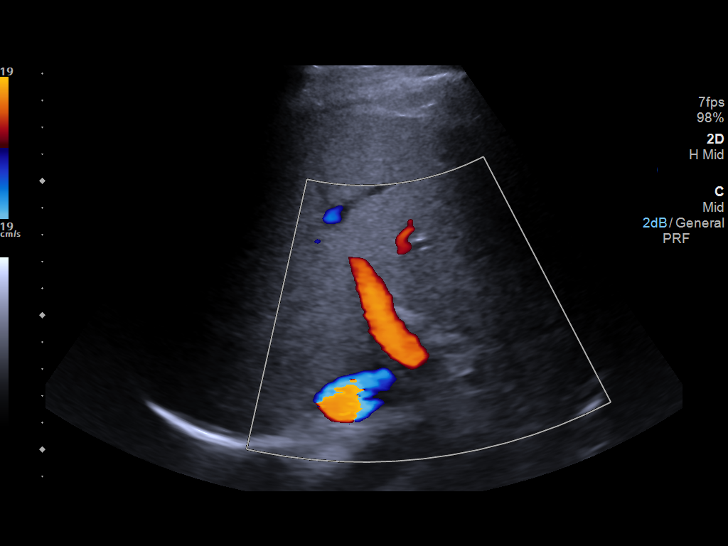

[14 of 25 positions shown; findings below may reference images not displayed]

FINDINGS: Gallbladder:

No gallstones or wall thickening visualized. There is no
pericholecystic fluid. No sonographic Murphy sign noted by
sonographer.

Common bile duct:

Diameter: 3 mm. No intrahepatic or extrahepatic biliary duct
dilatation.

Liver:

No focal lesion identified. Liver echogenicity overall is increased.
Portal vein is patent on color Doppler imaging with normal direction
of blood flow towards the liver.

Other: There is trace ascites.
IMPRESSION: 1. Diffuse increase in liver echogenicity, a finding indicative of
hepatic steatosis. There may well be underlying parenchymal liver
disease. No focal liver lesions are evident; it must be cautioned
that the sensitivity of ultrasound for detection of focal liver
lesions is somewhat diminished in this circumstance.

2.  Trace ascites.

## 2020-06-08 IMAGING — DX DG RIBS W/ CHEST 3+V*R*
5 series · 5 of 5 positions shown · non-contrast
Comparison: None.

CLINICAL DATA: Right rib pain after fall

EXAM:
RIGHT RIBS AND CHEST - 3+ VIEW

[chest pa]
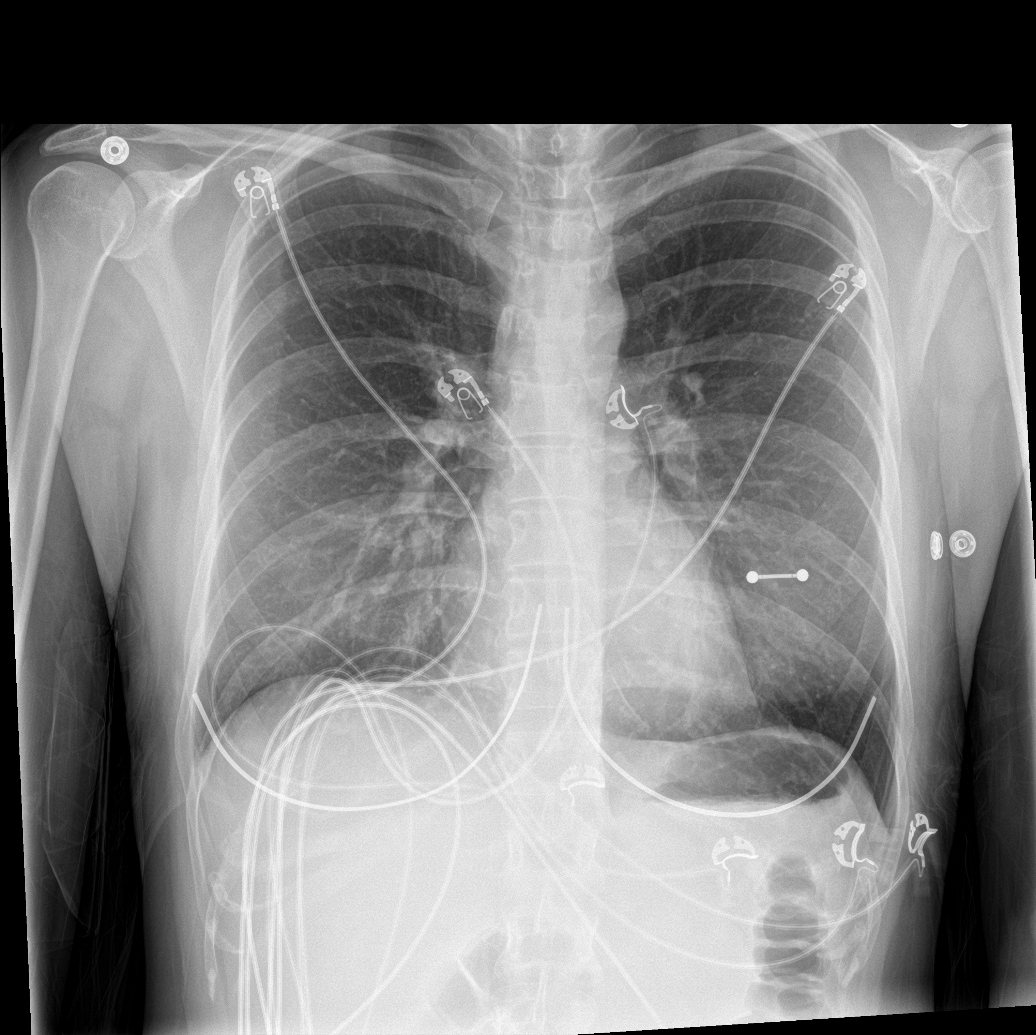

[rib ap obl (1 of 2)]
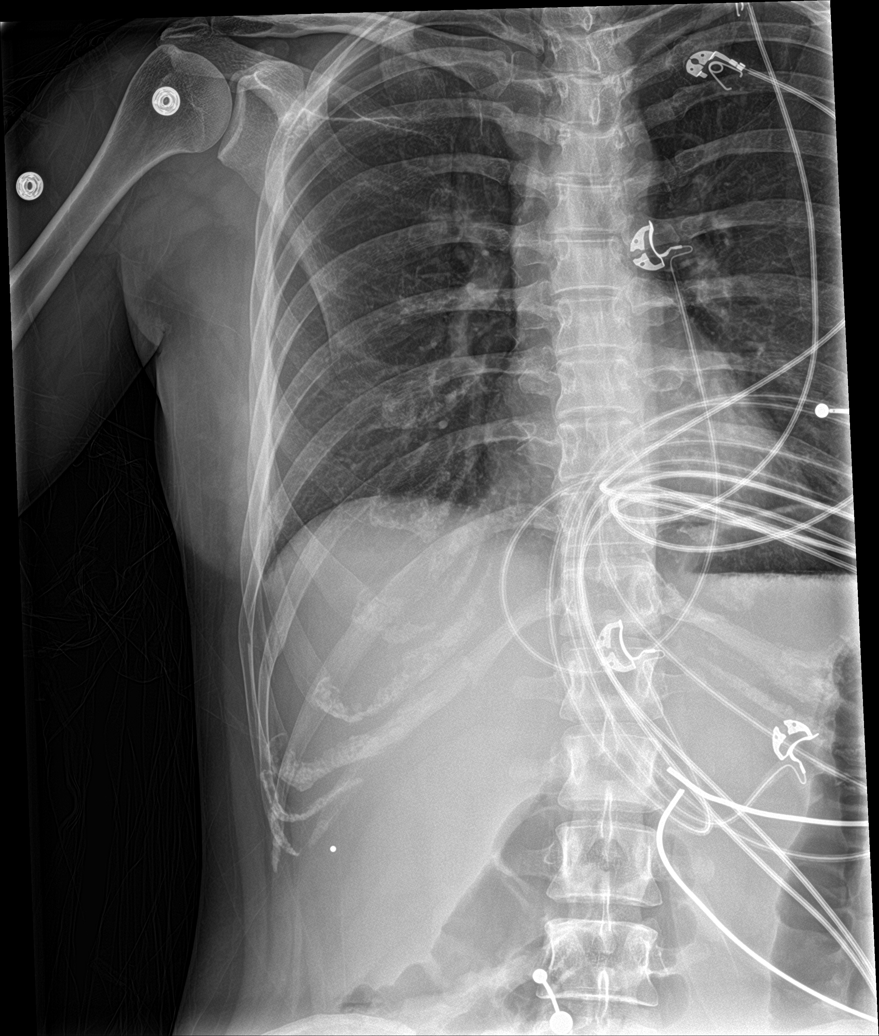

[rib ap (1 of 2)]
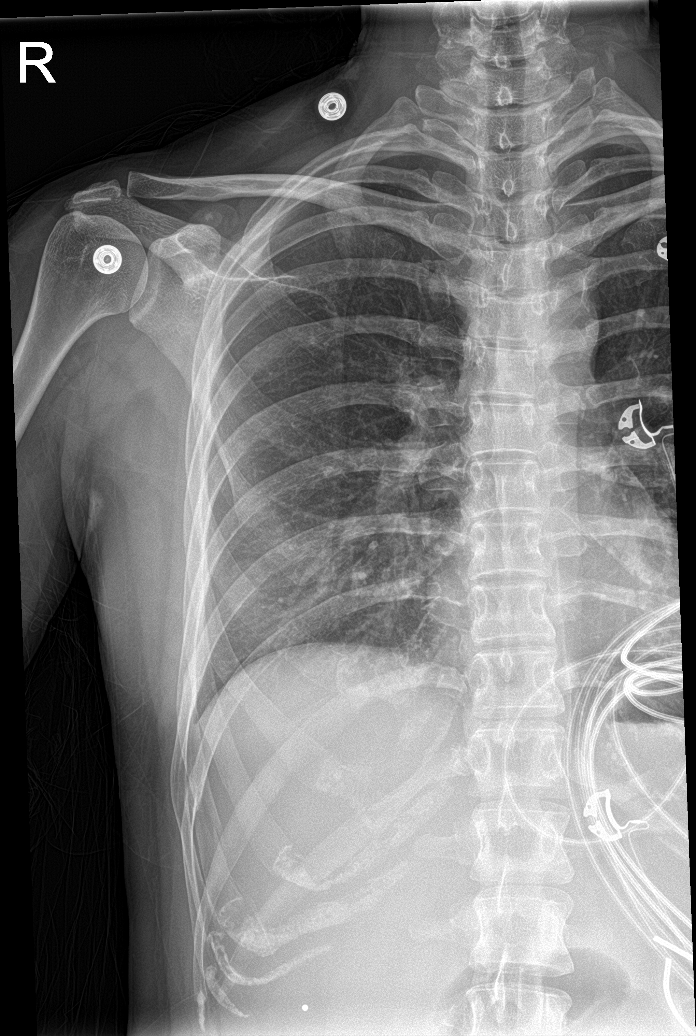

[rib ap (2 of 2)]
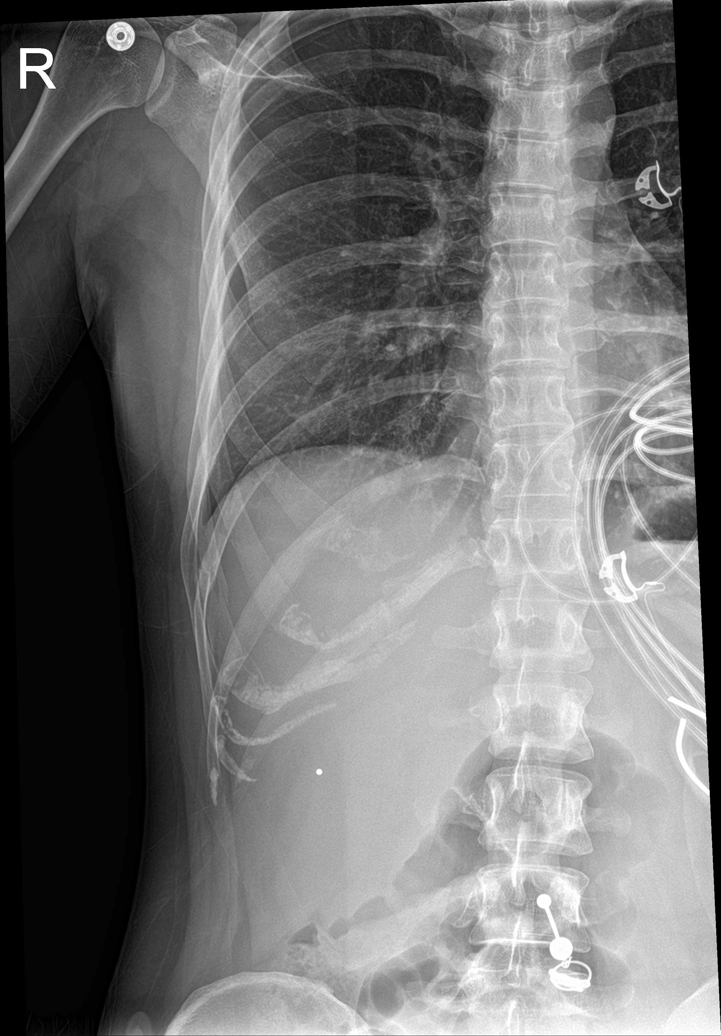

[rib ap obl (2 of 2)]
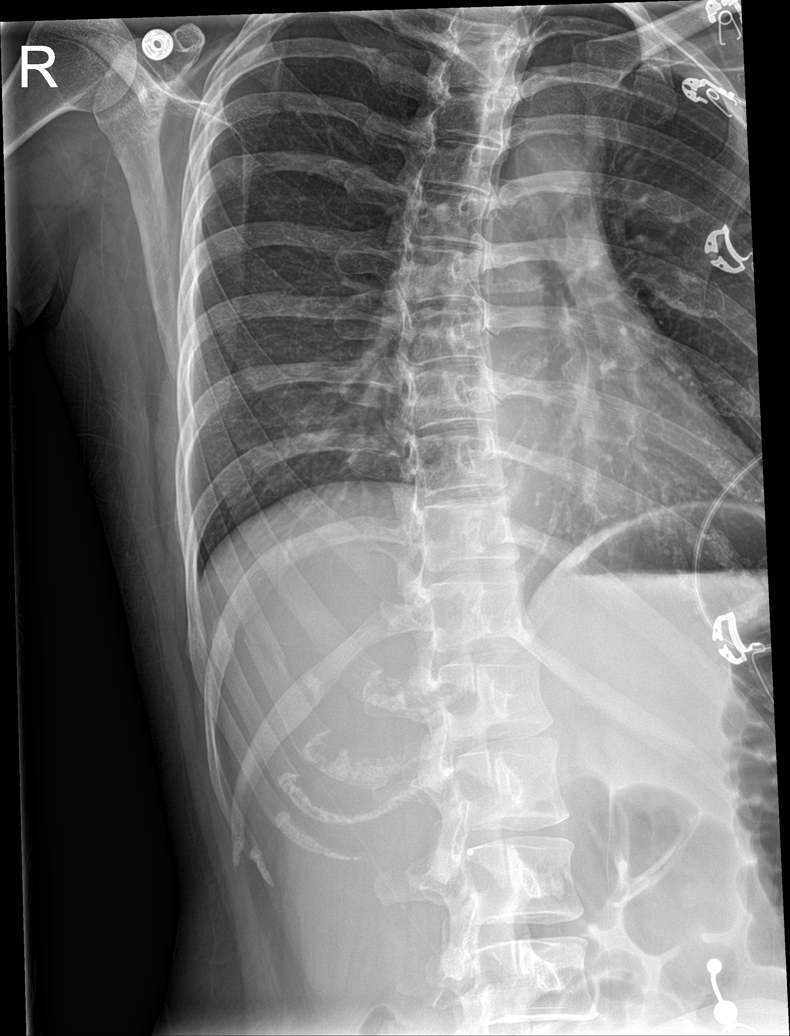

[5 of 5 positions shown; findings below may reference images not displayed]

FINDINGS: Mildly displaced lateral right eighth rib fracture. Remote posterior
right tenth and eleventh rib fracture. Mildly displaced posterior
right twelfth rib fracture. Nondisplaced posterior right eleventh
rib fracture. No evidence of hemothorax or pneumothorax. Normal
heart size and mediastinal contours.
IMPRESSION: 1. Acute right eighth, eleventh, and twelfth rib fractures with up
to mild displacement.
2. Remote right tenth and eleventh rib fractures.
3. No pneumothorax.

## 2021-07-15 ENCOUNTER — Telehealth: Payer: Self-pay | Admitting: *Deleted

## 2021-07-15 NOTE — Telephone Encounter (Signed)
Patient is having problems with her toe, possible infected, ingrown, Please schedule, np. ?

## 2021-07-17 ENCOUNTER — Other Ambulatory Visit: Payer: Self-pay

## 2021-07-17 ENCOUNTER — Ambulatory Visit (HOSPITAL_COMMUNITY)
Admission: EM | Admit: 2021-07-17 | Discharge: 2021-07-17 | Disposition: A | Discharge status: Home / Self Care | Payer: Self-pay | Attending: Nurse Practitioner | Admitting: Nurse Practitioner

## 2021-07-17 ENCOUNTER — Encounter (HOSPITAL_COMMUNITY): Payer: Self-pay | Admitting: *Deleted

## 2021-07-17 DIAGNOSIS — L509 Urticaria, unspecified: Secondary | ICD-10-CM

## 2021-07-17 DIAGNOSIS — R21 Rash and other nonspecific skin eruption: Secondary | ICD-10-CM

## 2021-07-17 MED ORDER — DIPHENHYDRAMINE HCL 25 MG PO TABS
25.0000 mg | ORAL_TABLET | Freq: Every evening | ORAL | 0 refills | Status: DC | PRN
Start: 2021-07-17 — End: 2023-08-07

## 2021-07-17 MED ORDER — CETIRIZINE HCL 10 MG PO TABS: 10.0000 mg | ORAL_TABLET | Freq: Every day | ORAL | 0 refills | Status: AC

## 2021-07-17 MED ORDER — PREDNISONE 10 MG PO TABS: ORAL_TABLET | ORAL | 0 refills | Status: DC

## 2021-07-17 NOTE — Discharge Instructions (Addendum)
-   Please restart the prednisone taper; I have sent in a new prescription for you; you will take 60 mg for 2 days, then 50 mg for 2 days, etc. Until the prescription is gone.   ?- Please stop the other prednisone you have been prescribed ?- Please start a daily oral antihistamine like cetirizine or loratadine in the morning ?- You can also take Benadryl 25-50 mg nightly to help with the itching ?- Start using the prescription ointment to the right lower leg twice daily every day; stop using after 1 week if not effective and seek care ?

## 2021-07-17 NOTE — ED Triage Notes (Addendum)
Pt reports 4 weeks ago after working in the yard she broke out in a rash. Pt reports rash on RT lower leg and on Rt arm . Pt reports she can not sleep at night and has double vision from not sleeping. [facility]  with out relief. ?

## 2021-07-17 NOTE — ED Provider Notes (Signed)
?[place] ? ? ? ?CSN: [id number] ?Arrival date & time: 07/17/21  1618 ? ? ?  ? ?History   ?Chief Complaint ?Chief Complaint  ?Patient presents with  ? Rash  ? Diplopia  ? ? ?HPI ?Angela Palmer is a 36 y.o. female.  ? ?Patient presents with fiance and reports rash started 2 weeks ago after doing some outdoor gardening.  She reports the rash started on her right lower extremity and is spread to her arms, back, and today her chest.  She has been taking a prednisone taper, has completed cephalexin, and has been using prescription strength hydrocortisone cream to help with the rash and itching.  She reports the rash is still severely itchy and she is not sleeping at night because of the rash.  She denies shortness of breath, wheezing, throat or tongue swelling.  She denies fevers, nausea/vomiting. ? ? ? ? ?Past Medical History:  ?Diagnosis Date  ? Alcohol abuse   ? Seizures ([place])   ? alcohol withdrawal seizures  ? ? ?Patient Active Problem List  ? Diagnosis Date Noted  ? Alcohol abuse   ? Acute alcohol intoxication ([place]) 08/03/2019  ? High anion gap metabolic acidosis [id number]  ? Abnormal liver enzymes 08/03/2019  ? Fracture of multiple ribs 06/27/2019  ? Alcohol withdrawal syndrome with complication ([place]) [id number]  ? Hypomagnesemia 09/27/2017  ? Seizure ([place]) 05/07/2016  ? Alcohol dependence with withdrawal with complication ([place]) 0000000  ? Hypokalemia 05/07/2016  ? Transaminitis 05/07/2016  ? Metabolic acidosis 0000000  ? ? ?History reviewed. No pertinent surgical history. ? ?OB History   ?No obstetric history on file. ?  ? ? ? ?Home Medications   ? ?Prior to Admission medications   ?Medication Sig Start Date End Date Taking? Authorizing Provider  ?cetirizine (ZYRTEC) 10 MG tablet Take 1 tablet (10 mg total) by mouth daily. 07/17/21  Yes Eulogio Bear, NP  ?diphenhydrAMINE (BENADRYL) 25 MG tablet Take 1-2 tablets (25-50 mg total) by mouth at bedtime as needed for itching. 07/17/21  Yes  Eulogio Bear, NP  ?predniSONE (DELTASONE) 10 MG tablet Take 6 tablets by mouth daily for 2 days, then reduce by 1 tablet every 2 days until gone 07/17/21  Yes Eulogio Bear, NP  ?chlordiazePOXIDE (LIBRIUM) 25 MG capsule Take 1 caplet (25 mg) twice today (6pm, 10 pm), 3 times on Saturday (8am,12pm,5pm), twice on Sunday (8am, 5pm) and then once (8am) on Monday 08/04/19   Jeanmarie Hubert, MD  ?folic acid (FOLVITE) 1 MG tablet Take 1 tablet (1 mg total) by mouth daily. 06/28/19   Mitzi Hansen, MD  ?levonorgestrel-ethinyl estradiol (ALESSE) 0.1-20 MG-MCG tablet Take 1 tablet by mouth daily. 06/28/19   Mitzi Hansen, MD  ? ? ?Family History ?Family History  ?Problem Relation Age of Onset  ? Hypertension Mother   ? ? ?Social History ?Social History  ? ?Tobacco Use  ? Smoking status: Never  ? Smokeless tobacco: Never  ?Vaping Use  ? Vaping Use: Never used  ?Substance Use Topics  ? Alcohol use: Yes  ?  Alcohol/week: 0.0 standard drinks  ?  Comment: daily use-1/2-3/4 fifth of vodka   ? Drug use: No  ? ? ? ?Allergies   ?Minocycline ? ? ?Review of Systems ?Review of Systems ?Per HPI ? ?Physical Exam ?Triage Vital Signs ?ED Triage Vitals  ?Enc Vitals Group  ?   BP 07/17/21 1751 122/72  ?   Pulse Rate 07/17/21 1751 94  ?   Resp 07/17/21  1751 18  ?   Temp 07/17/21 1751 98 ?F (36.7 ?C)  ?   Temp src --   ?   SpO2 07/17/21 1751 99 %  ?   Weight --   ?   Height --   ?   Head Circumference --   ?   Peak Flow --   ?   Pain Score 07/17/21 1749 0  ?   Pain Loc --   ?   Pain Edu? --   ?   Excl. in [place]? --   ? ?No data found. ? ?Updated Vital Signs ?BP 122/72   Pulse 94   Temp 98 ?F (36.7 ?C)   Resp 18   LMP 06/26/2021 (Approximate)   SpO2 99%  ? ?Visual Acuity ?Right Eye Distance:   ?Left Eye Distance:   ?Bilateral Distance:   ? ?Right Eye Near:   ?Left Eye Near:    ?Bilateral Near:    ? ?Physical Exam ?Vitals and nursing note reviewed.  ?Constitutional:   ?   General: She is not in acute distress. ?   Appearance:  Normal appearance. She is not ill-appearing or toxic-appearing.  ?Pulmonary:  ?   Effort: Pulmonary effort is normal. No respiratory distress.  ?Skin: ?   General: Skin is warm and dry.  ?   Capillary Refill: Capillary refill takes less than 2 seconds.  ?   Findings: Rash present. Rash is papular and urticarial.  ?   Comments: Widespread papular urticaria to bilateral arms, legs, trunk.  surrounding erythema; there is a patch of hyperpigmented erythema to right lower extremity  ?Neurological:  ?   Mental Status: She is alert and oriented to person, place, and time.  ?Psychiatric:     ?   Mood and Affect: Mood normal.     ?   Behavior: Behavior normal.  ? ? ? ?UC Treatments / Results  ?Labs ?(all labs ordered are listed, but only abnormal results are displayed) ?Labs Reviewed - No data to display ? ?EKG ? ? ?Radiology ?No results found. ? ?Procedures ?Procedures (including critical care time) ? ?Medications Ordered in UC ?Medications - No data to display ? ?Initial Impression / Assessment and Plan / UC Course  ?I have reviewed the triage vital signs and the nursing notes. ? ?Pertinent labs & imaging results that were available during my care of the patient were reviewed by me and considered in my medical decision making (see chart for details). ? ?  ?We will restart prednisone taper starting at higher dose of 60 mg for 2 days, tapering down by 1 pill every 2 days until the prescription is gone.  Also, start daily oral antihistamine to help with histamine response.  Can also use Benadryl at nighttime to help with histamine response and itching.  Continue topical ointment to right lower extremity twice daily.  The patient was given the opportunity to ask questions.  All questions answered to their satisfaction.  The patient is in agreement to this plan.  ? ?Final Clinical Impressions(s) / UC Diagnoses  ? ?Final diagnoses:  ?Rash  ?Urticaria  ? ? ? ?Discharge Instructions   ? ?  ?- Please restart the prednisone taper; I  have sent in a new prescription for you; you will take 60 mg for 2 days, then 50 mg for 2 days, etc. Until the prescription is gone.   ?- Please stop the other prednisone you have been prescribed ?- Please start a daily oral antihistamine like cetirizine  or loratadine in the morning ?- You can also take Benadryl 25-50 mg nightly to help with the itching ?- Start using the prescription ointment to the right lower leg twice daily every day; stop using after 1 week if not effective and seek care ? ? ? ? ?ED Prescriptions   ? ? Medication Sig Dispense Auth. Provider  ? predniSONE (DELTASONE) 10 MG tablet Take 6 tablets by mouth daily for 2 days, then reduce by 1 tablet every 2 days until gone 42 tablet Noemi Chapel A, NP  ? cetirizine (ZYRTEC) 10 MG tablet Take 1 tablet (10 mg total) by mouth daily. 30 tablet Noemi Chapel A, NP  ? diphenhydrAMINE (BENADRYL) 25 MG tablet Take 1-2 tablets (25-50 mg total) by mouth at bedtime as needed for itching. 60 tablet Eulogio Bear, NP  ? ?  ? ?PDMP not reviewed this encounter. ?  ?Eulogio Bear, NP ?07/17/21 1822 ? ?

## 2021-07-30 ENCOUNTER — Emergency Department (HOSPITAL_COMMUNITY): Admission: EM | Admit: 2021-07-30 | Discharge: 2021-07-31 | Discharge status: Against Medical Advice | Payer: Self-pay

## 2021-07-30 NOTE — ED Notes (Signed)
Called x 1 without answer in the waiting room ?

## 2021-07-30 NOTE — ED Notes (Signed)
Called pt x3 for triage, still no response. Moving pt OTF.  ?

## 2022-09-28 ENCOUNTER — Other Ambulatory Visit: Payer: Self-pay

## 2022-09-28 ENCOUNTER — Emergency Department (HOSPITAL_COMMUNITY)
Admission: EM | Admit: 2022-09-28 | Discharge: 2022-09-28 | Disposition: A | Discharge status: Home / Self Care | Payer: Self-pay | Attending: Emergency Medicine | Admitting: Emergency Medicine

## 2022-09-28 DIAGNOSIS — R569 Unspecified convulsions: Secondary | ICD-10-CM | POA: Insufficient documentation

## 2022-09-28 DIAGNOSIS — F10139 Alcohol abuse with withdrawal, unspecified: Secondary | ICD-10-CM | POA: Insufficient documentation

## 2022-09-28 DIAGNOSIS — Z1152 Encounter for screening for COVID-19: Secondary | ICD-10-CM | POA: Insufficient documentation

## 2022-09-28 DIAGNOSIS — R000 Tachycardia, unspecified: Secondary | ICD-10-CM | POA: Insufficient documentation

## 2022-09-28 DIAGNOSIS — N3000 Acute cystitis without hematuria: Secondary | ICD-10-CM | POA: Insufficient documentation

## 2022-09-28 DIAGNOSIS — F10930 Alcohol use, unspecified with withdrawal, uncomplicated: Secondary | ICD-10-CM

## 2022-09-28 LAB — RESP PANEL BY RT-PCR (RSV, FLU A&B, COVID)  RVPGX2
Influenza A by PCR: NEGATIVE
Influenza B by PCR: NEGATIVE
Resp Syncytial Virus by PCR: NEGATIVE
SARS Coronavirus 2 by RT PCR: NEGATIVE

## 2022-09-28 LAB — URINALYSIS, ROUTINE W REFLEX MICROSCOPIC
Bilirubin Urine: NEGATIVE
Glucose, UA: NEGATIVE mg/dL
Ketones, ur: 5 mg/dL — AB
Nitrite: POSITIVE — AB
Protein, ur: >=300 mg/dL — AB
Specific Gravity, Urine: 1.015 (ref 1.005–1.030)
WBC, UA: >50 WBC/hpf (ref 0–5)
pH: 6 (ref 5.0–8.0)

## 2022-09-28 LAB — CBC WITH DIFFERENTIAL/PLATELET
Abs Immature Granulocytes: 0.03 10*3/uL (ref 0.00–0.07)
Basophils Absolute: 0.1 10*3/uL (ref 0.0–0.1)
Basophils Relative: 1 %
Eosinophils Absolute: 0 10*3/uL (ref 0.0–0.5)
Eosinophils Relative: 0 %
HCT: 40.5 % (ref 36.0–46.0)
Hemoglobin: 13.9 g/dL (ref 12.0–15.0)
Immature Granulocytes: 0 %
Lymphocytes Relative: 6 %
Lymphs Abs: 0.5 10*3/uL — ABNORMAL LOW (ref 0.7–4.0)
MCH: 35.9 pg — ABNORMAL HIGH (ref 26.0–34.0)
MCHC: 34.3 g/dL (ref 30.0–36.0)
MCV: 104.7 fL — ABNORMAL HIGH (ref 80.0–100.0)
Monocytes Absolute: 0.7 10*3/uL (ref 0.1–1.0)
Monocytes Relative: 8 %
Neutro Abs: 6.7 10*3/uL (ref 1.7–7.7)
Neutrophils Relative %: 85 %
Platelets: 236 10*3/uL (ref 150–400)
RBC: 3.87 MIL/uL (ref 3.87–5.11)
RDW: 12 % (ref 11.5–15.5)
WBC: 8 10*3/uL (ref 4.0–10.5)
nRBC: 0 % (ref 0.0–0.2)

## 2022-09-28 LAB — I-STAT BETA HCG BLOOD, ED (MC, WL, AP ONLY): I-stat hCG, quantitative: <5 m[IU]/mL (ref <5)

## 2022-09-28 LAB — COMPREHENSIVE METABOLIC PANEL
ALT: 30 U/L (ref 0–44)
AST: 53 U/L — ABNORMAL HIGH (ref 15–41)
Albumin: 4.6 g/dL (ref 3.5–5.0)
Alkaline Phosphatase: 50 U/L (ref 38–126)
Anion gap: 16 — ABNORMAL HIGH (ref 5–15)
BUN: <5 mg/dL — ABNORMAL LOW (ref 6–20)
CO2: 25 mmol/L (ref 22–32)
Calcium: 9.2 mg/dL (ref 8.9–10.3)
Chloride: 94 mmol/L — ABNORMAL LOW (ref 98–111)
Creatinine, Ser: 0.55 mg/dL (ref 0.44–1.00)
GFR, Estimated: >60 mL/min (ref >60)
Glucose, Bld: 132 mg/dL — ABNORMAL HIGH (ref 70–99)
Potassium: 2.9 mmol/L — ABNORMAL LOW (ref 3.5–5.1)
Sodium: 135 mmol/L (ref 135–145)
Total Bilirubin: 1.3 mg/dL — ABNORMAL HIGH (ref 0.3–1.2)
Total Protein: 8.5 g/dL — ABNORMAL HIGH (ref 6.5–8.1)

## 2022-09-28 LAB — TSH: TSH: 1.52 u[IU]/mL (ref 0.350–4.500)

## 2022-09-28 LAB — RAPID URINE DRUG SCREEN, HOSP PERFORMED
Amphetamines: NOT DETECTED
Barbiturates: NOT DETECTED
Benzodiazepines: NOT DETECTED
Cocaine: NOT DETECTED
Opiates: NOT DETECTED
Tetrahydrocannabinol: NOT DETECTED

## 2022-09-28 LAB — MAGNESIUM: Magnesium: 1.5 mg/dL — ABNORMAL LOW (ref 1.7–2.4)

## 2022-09-28 LAB — ETHANOL: Alcohol, Ethyl (B): <10 mg/dL (ref <10)

## 2022-09-28 MED ORDER — LORAZEPAM 2 MG/ML IJ SOLN
2.0000 mg | Freq: Once | INTRAMUSCULAR | Status: AC
  Administered 2022-09-28: 2 mg via INTRAVENOUS
  Filled 2022-09-28: qty 1

## 2022-09-28 MED ORDER — SODIUM CHLORIDE 0.9 % IV BOLUS
1000.0000 mL | Freq: Once | INTRAVENOUS | Status: AC
  Administered 2022-09-28: 1000 mL via INTRAVENOUS

## 2022-09-28 MED ORDER — POTASSIUM CHLORIDE CRYS ER 20 MEQ PO TBCR
40.0000 meq | EXTENDED_RELEASE_TABLET | Freq: Once | ORAL | Status: AC
  Administered 2022-09-28: 40 meq via ORAL
  Filled 2022-09-28: qty 2

## 2022-09-28 MED ORDER — CHLORDIAZEPOXIDE HCL 25 MG PO CAPS: ORAL_CAPSULE | ORAL | 0 refills | Status: DC

## 2022-09-28 MED ORDER — CEPHALEXIN 500 MG PO CAPS: 500.0000 mg | ORAL_CAPSULE | Freq: Two times a day (BID) | ORAL | 0 refills | Status: AC

## 2022-09-28 MED ORDER — MAGNESIUM OXIDE -MG SUPPLEMENT 400 (240 MG) MG PO TABS
400.0000 mg | ORAL_TABLET | Freq: Once | ORAL | Status: AC
  Administered 2022-09-28: 400 mg via ORAL
  Filled 2022-09-28: qty 1

## 2022-09-28 NOTE — Discharge Instructions (Signed)
Your history, exam, and evaluation today are consistent with alcohol withdrawal likely causing some of your symptoms as well as a urinary tract infection.  As you did not have a seizure today and your symptoms improved after medications, we had a shared decision-making conversation and agree with discharge home with a Librium taper as we discussed to help prevent alcohol withdrawal troubles.  Please rest and stay hydrated and take the antibiotics to treat the urinary tract infection.  Please follow-up with a primary doctor and use some of the substance abuse resources to get help with your drinking.  If any symptoms change or worsen acutely, please return to the nearest emergency department.

## 2022-09-28 NOTE — ED Triage Notes (Signed)
Patient brought in my EMS from home with c/o seizure caused by alcohol withdrawal. Per patient husband she has received treatment, rehab and detox for alcohol issue. She states she drinks beer daily and her last drink was last night.  138/98 94 98% RA CBG:130

## 2022-09-28 NOTE — ED Provider Notes (Signed)
[place] EMERGENCY DEPARTMENT AT [facility] Provider Note   CSN: [id number] Arrival date & time: 09/28/22  1007     History  Chief Complaint  Patient presents with   Seizures    Angela Palmer is a 37 y.o. female.  The history is provided by the patient and medical records. No language interpreter was used.  Seizures Seizure activity on arrival: no   Seizure type:  Unable to specify Initial focality:  None Alcohol Intoxication This is a chronic problem. The problem occurs constantly. Pertinent negatives include no chest pain, no abdominal pain, no headaches (resolved now) and no shortness of breath. Nothing aggravates the symptoms. Nothing relieves the symptoms. She has tried nothing for the symptoms. The treatment provided no relief.       Home Medications Prior to Admission medications   Medication Sig Start Date End Date Taking? Authorizing Provider  cetirizine (ZYRTEC) 10 MG tablet Take 1 tablet (10 mg total) by mouth daily. 07/17/21   Valentino Nose, NP  chlordiazePOXIDE (LIBRIUM) 25 MG capsule Take 1 caplet (25 mg) twice today (6pm, 10 pm), 3 times on Saturday (8am,12pm,5pm), twice on Sunday (8am, 5pm) and then once (8am) on Monday 08/04/19   Katherine Roan, MD  diphenhydrAMINE (BENADRYL) 25 MG tablet Take 1-2 tablets (25-50 mg total) by mouth at bedtime as needed for itching. 07/17/21   Valentino Nose, NP  folic acid (FOLVITE) 1 MG tablet Take 1 tablet (1 mg total) by mouth daily. 06/28/19   Elige Radon, MD  levonorgestrel-ethinyl estradiol (ALESSE) 0.1-20 MG-MCG tablet Take 1 tablet by mouth daily. 06/28/19   Elige Radon, MD  predniSONE (DELTASONE) 10 MG tablet Take 6 tablets by mouth daily for 2 days, then reduce by 1 tablet every 2 days until gone 07/17/21   Valentino Nose, NP      Allergies    Minocycline    Review of Systems   Review of Systems  Constitutional:  Negative for chills, fatigue and fever.  HENT:  Negative for  congestion.   Eyes:  Negative for pain and visual disturbance.  Respiratory:  Negative for cough, chest tightness, shortness of breath and wheezing.   Cardiovascular:  Negative for chest pain and palpitations.  Gastrointestinal:  Positive for nausea. Negative for abdominal distention, abdominal pain, constipation, diarrhea and vomiting.  Genitourinary:  Negative for dysuria and flank pain.  Musculoskeletal:  Negative for back pain, neck pain and neck stiffness.  Skin:  Negative for rash and wound.  Neurological:  Positive for tremors and seizures. Negative for dizziness, facial asymmetry, speech difficulty, weakness, light-headedness, numbness and headaches (resolved now).  Psychiatric/Behavioral:  Negative for agitation and confusion. The patient is nervous/anxious.   All other systems reviewed and are negative.   Physical Exam Updated Vital Signs BP (!) 152/110 (BP Location: Right Arm)   Pulse (!) 108   Temp 99.2 F (37.3 C) (Oral)   Resp 20   Ht 5\' 5"  (1.651 m)   Wt 56.7 kg   SpO2 100%   BMI 20.80 kg/m  Physical Exam Vitals and nursing note reviewed.  Constitutional:      General: She is not in acute distress.    Appearance: She is well-developed. She is not ill-appearing, toxic-appearing or diaphoretic.  HENT:     Head: Normocephalic and atraumatic.     Nose: No congestion or rhinorrhea.     Mouth/Throat:     Mouth: Mucous membranes are dry.     Pharynx: No oropharyngeal  exudate or posterior oropharyngeal erythema.  Eyes:     Extraocular Movements: Extraocular movements intact.     Conjunctiva/sclera: Conjunctivae normal.     Pupils: Pupils are equal, round, and reactive to light.  Cardiovascular:     Rate and Rhythm: Regular rhythm. Tachycardia present.     Pulses: Normal pulses.     Heart sounds: No murmur heard. Pulmonary:     Effort: Pulmonary effort is normal. No respiratory distress.     Breath sounds: Normal breath sounds. No wheezing, rhonchi or rales.   Chest:     Chest wall: No tenderness.  Abdominal:     General: Abdomen is flat.     Palpations: Abdomen is soft.     Tenderness: There is no abdominal tenderness. There is no right CVA tenderness, left CVA tenderness, guarding or rebound.  Musculoskeletal:        General: No swelling or tenderness.     Cervical back: Neck supple. No tenderness.     Right lower leg: No edema.     Left lower leg: No edema.  Skin:    General: Skin is warm and dry.     Capillary Refill: Capillary refill takes less than 2 seconds.     Coloration: Skin is not pale.     Findings: No erythema or rash.  Neurological:     General: No focal deficit present.     Mental Status: She is alert.     Sensory: No sensory deficit.     Motor: No weakness.  Psychiatric:        Mood and Affect: Mood normal.     ED Results / Procedures / Treatments   Labs (all labs ordered are listed, but only abnormal results are displayed) Labs Reviewed  CBC WITH DIFFERENTIAL/PLATELET - Abnormal; Notable for the following components:      Result Value   MCV 104.7 (*)    MCH 35.9 (*)    Lymphs Abs 0.5 (*)    All other components within normal limits  COMPREHENSIVE METABOLIC PANEL - Abnormal; Notable for the following components:   Potassium 2.9 (*)    Chloride 94 (*)    Glucose, Bld 132 (*)    BUN <5 (*)    Total Protein 8.5 (*)    AST 53 (*)    Total Bilirubin 1.3 (*)    Anion gap 16 (*)    All other components within normal limits  MAGNESIUM - Abnormal; Notable for the following components:   Magnesium 1.5 (*)    All other components within normal limits  URINALYSIS, ROUTINE W REFLEX MICROSCOPIC - Abnormal; Notable for the following components:   Color, Urine AMBER (*)    APPearance HAZY (*)    Hgb urine dipstick SMALL (*)    Ketones, ur 5 (*)    Protein, ur >=300 (*)    Nitrite POSITIVE (*)    Leukocytes,Ua SMALL (*)    Bacteria, UA MANY (*)    All other components within normal limits  RESP PANEL BY RT-PCR  (RSV, FLU A&B, COVID)  RVPGX2  TSH  ETHANOL  RAPID URINE DRUG SCREEN, HOSP PERFORMED  I-STAT BETA HCG BLOOD, ED ([facility], [facility], [facility] ONLY)    EKG EKG Interpretation  Date/Time:  Monday September 28 2022 10:18:19 EDT Ventricular Rate:  104 PR Interval:  150 QRS Duration: 97 QT Interval:  358 QTC Calculation: 471 R Axis:   61 Text Interpretation: Sinus tachycardia Minimal ST depression, inferior leads when compared to proir,  similar appearance, No STEMI Confirmed by Theda Belfast ([id number]) on 09/28/2022 10:52:39 AM  Radiology No results found.  Procedures Procedures    Medications Ordered in ED Medications  potassium chloride SA (KLOR-CON M) CR tablet 40 mEq (has no administration in time range)  magnesium oxide (MAG-OX) tablet 400 mg (has no administration in time range)  sodium chloride 0.9 % bolus 1,000 mL (1,000 mLs Intravenous New Bag/Given 09/28/22 1127)  LORazepam (ATIVAN) injection 2 mg (2 mg Intravenous Given 09/28/22 1125)    ED Course/ Medical Decision Making/ A&P                             Medical Decision Making Amount and/or Complexity of Data Reviewed Labs: ordered.  Risk Prescription drug management.    Angela Palmer is a 37 y.o. female with a past medical history significant for hypomagnesemia, alcohol abuse, and previous alcohol drawl seizures who presents with withdrawal.  Current patient, she has not had an alcoholic drink in several days and says she normally drinks that she said that for the last day or so she has had jitteriness, mild headaches, some nausea, tactile hallucinations, shakiness, and fast heart rate consistent with withdrawal.  She said that she is felt like this for she had seizure in the past.  She otherwise denies fevers, chills, congestion, cough.  Denies constipation, diarrhea, or urinary changes.  Denies any rashes.  Reports she does not want to fully quit drinking and would rather not be admitted today.  She would like to try to get to  feeling better and then discussed resources or medications to help prevent withdrawal seizures.  On exam, patient is tachycardic and tachypneic and is jittery.  She had clear breath sounds and chest and abdomen were nontender.  No focal neurologic deficit initially with intact sensation and strength in extremities.  Symmetric smile.  Clear speech.  Pupils symmetric and reactive with normal extract movements.  She did not have asterixis but did have mild tremors.  Patient otherwise resting.  We agreed together to get some labs but also give her some fluids and some Ativan.  She does not take any seizure medicine and has not had a seizure in a week or 2 she reports.  She denies any head injury and reports no headaches at this time.  No reported neck pain or neck stiffness.  Patient otherwise resting.  Will give some Ativan and fluids and get some screening labs.  Patient is feeling better, anticipate discharge with Librium taper as she has done in the past and outpatient follow-up.  Anticipate reassessment, if symptoms change or worsen acutely, will consider more extensive workup today and possible admission.  2:24 PM Workup began to return.  Patient does have some low potassium and low magnesium which has had in the past.  Will give oral potassium and magnesium supplementation.  She also now says she is having some urinary symptoms as her urinalysis is consistent with recurrent UTI.  Will give prescription for antibiotics.  Patient reports she still does not want to be admitted and is feeling better after fluids and Ativan.  She would like to go home.  Will give prescription for Librium taper and antibiotics for the UTI.  She will follow-up with PCP.  She understood return precautions and after passes p.o. challenge anticipate discharge home.  Patient passed p.o. challenge and is feeling better.  She would like to go home with prescription for  antibiotics and a Librium.  She understood return  precautions and follow-up instructions and was discharged in good condition with outpatient alcohol/substance abuse resources.        Final Clinical Impression(s) / ED Diagnoses Final diagnoses:  Alcohol withdrawal syndrome without complication (HCC)  Acute cystitis without hematuria    Rx / DC Orders ED Discharge Orders          Ordered    cephALEXin (KEFLEX) 500 MG capsule  2 times daily        09/28/22 1449    chlordiazePOXIDE (LIBRIUM) 25 MG capsule        09/28/22 1449            Clinical Impression: 1. Alcohol withdrawal syndrome without complication (HCC)   2. Acute cystitis without hematuria     Disposition: Discharge  Condition: Good  I have discussed the results, Dx and Tx plan with the pt(& family if present). He/she/they expressed understanding and agree(s) with the plan. Discharge instructions discussed at great length. Strict return precautions discussed and pt &/or family have verbalized understanding of the instructions. No further questions at time of discharge.    New Prescriptions   CEPHALEXIN (KEFLEX) 500 MG CAPSULE    Take 1 capsule (500 mg total) by mouth 2 (two) times daily for 7 days.   CHLORDIAZEPOXIDE (LIBRIUM) 25 MG CAPSULE    50mg  PO TID x 1D, then 25-50mg  PO BID X 1D, then 25-50mg  PO QD X 1D    Follow Up: [facility] [address] [place] [address] [place] [address] [phone] Schedule an appointment as soon as possible for a visit        Kaymarie Wynn, Canary Brim, MD 09/28/22 1451

## 2023-08-07 ENCOUNTER — Inpatient Hospital Stay (HOSPITAL_COMMUNITY)
Admission: EM | Admit: 2023-08-07 | Discharge: 2023-08-12 | DRG: 439 | Disposition: A | Discharge status: Home / Self Care | Payer: Self-pay | Attending: Internal Medicine | Admitting: Internal Medicine

## 2023-08-07 ENCOUNTER — Emergency Department (HOSPITAL_COMMUNITY): Payer: Self-pay

## 2023-08-07 ENCOUNTER — Other Ambulatory Visit: Payer: Self-pay

## 2023-08-07 ENCOUNTER — Encounter (HOSPITAL_COMMUNITY): Payer: Self-pay

## 2023-08-07 DIAGNOSIS — E8729 Other acidosis: Secondary | ICD-10-CM | POA: Diagnosis present

## 2023-08-07 DIAGNOSIS — K852 Alcohol induced acute pancreatitis without necrosis or infection: Principal | ICD-10-CM | POA: Diagnosis present

## 2023-08-07 DIAGNOSIS — Z8249 Family history of ischemic heart disease and other diseases of the circulatory system: Secondary | ICD-10-CM

## 2023-08-07 DIAGNOSIS — R109 Unspecified abdominal pain: Secondary | ICD-10-CM | POA: Diagnosis present

## 2023-08-07 DIAGNOSIS — E872 Acidosis, unspecified: Secondary | ICD-10-CM | POA: Diagnosis present

## 2023-08-07 DIAGNOSIS — Z881 Allergy status to other antibiotic agents status: Secondary | ICD-10-CM

## 2023-08-07 DIAGNOSIS — E860 Dehydration: Secondary | ICD-10-CM | POA: Diagnosis present

## 2023-08-07 DIAGNOSIS — E871 Hypo-osmolality and hyponatremia: Secondary | ICD-10-CM | POA: Diagnosis present

## 2023-08-07 DIAGNOSIS — R202 Paresthesia of skin: Secondary | ICD-10-CM | POA: Diagnosis present

## 2023-08-07 DIAGNOSIS — K5900 Constipation, unspecified: Secondary | ICD-10-CM | POA: Diagnosis present

## 2023-08-07 DIAGNOSIS — Z79899 Other long term (current) drug therapy: Secondary | ICD-10-CM | POA: Diagnosis not present

## 2023-08-07 DIAGNOSIS — R636 Underweight: Secondary | ICD-10-CM | POA: Diagnosis present

## 2023-08-07 DIAGNOSIS — R7401 Elevation of levels of liver transaminase levels: Secondary | ICD-10-CM | POA: Diagnosis present

## 2023-08-07 DIAGNOSIS — K760 Fatty (change of) liver, not elsewhere classified: Secondary | ICD-10-CM | POA: Diagnosis present

## 2023-08-07 DIAGNOSIS — F331 Major depressive disorder, recurrent, moderate: Secondary | ICD-10-CM | POA: Diagnosis present

## 2023-08-07 DIAGNOSIS — F1020 Alcohol dependence, uncomplicated: Secondary | ICD-10-CM | POA: Diagnosis present

## 2023-08-07 DIAGNOSIS — Z681 Body mass index (BMI) 19 or less, adult: Secondary | ICD-10-CM | POA: Diagnosis not present

## 2023-08-07 DIAGNOSIS — N179 Acute kidney failure, unspecified: Secondary | ICD-10-CM | POA: Diagnosis present

## 2023-08-07 DIAGNOSIS — E876 Hypokalemia: Secondary | ICD-10-CM | POA: Diagnosis present

## 2023-08-07 DIAGNOSIS — R1114 Bilious vomiting: Secondary | ICD-10-CM

## 2023-08-07 DIAGNOSIS — R1112 Projectile vomiting: Secondary | ICD-10-CM | POA: Diagnosis not present

## 2023-08-07 DIAGNOSIS — B9620 Unspecified Escherichia coli [E. coli] as the cause of diseases classified elsewhere: Secondary | ICD-10-CM | POA: Diagnosis present

## 2023-08-07 DIAGNOSIS — K7010 Alcoholic hepatitis without ascites: Secondary | ICD-10-CM | POA: Diagnosis present

## 2023-08-07 DIAGNOSIS — Z87891 Personal history of nicotine dependence: Secondary | ICD-10-CM | POA: Diagnosis not present

## 2023-08-07 DIAGNOSIS — R739 Hyperglycemia, unspecified: Secondary | ICD-10-CM | POA: Diagnosis present

## 2023-08-07 DIAGNOSIS — R569 Unspecified convulsions: Secondary | ICD-10-CM | POA: Diagnosis present

## 2023-08-07 DIAGNOSIS — R112 Nausea with vomiting, unspecified: Secondary | ICD-10-CM | POA: Diagnosis present

## 2023-08-07 DIAGNOSIS — N390 Urinary tract infection, site not specified: Secondary | ICD-10-CM | POA: Diagnosis present

## 2023-08-07 DIAGNOSIS — F1010 Alcohol abuse, uncomplicated: Secondary | ICD-10-CM | POA: Diagnosis present

## 2023-08-07 LAB — CBC WITH DIFFERENTIAL/PLATELET
Abs Immature Granulocytes: 0.05 10*3/uL (ref 0.00–0.07)
Abs Immature Granulocytes: 0.04 K/uL (ref 0.00–0.07)
Basophils Absolute: 0 10*3/uL (ref 0.0–0.1)
Basophils Absolute: 0 K/uL (ref 0.0–0.1)
Basophils Relative: 0 %
Basophils Relative: 0 %
Eosinophils Absolute: 0 10*3/uL (ref 0.0–0.5)
Eosinophils Absolute: 0 K/uL (ref 0.0–0.5)
Eosinophils Relative: 0 %
Eosinophils Relative: 0 %
HCT: 40.5 % (ref 36.0–46.0)
HCT: 40.9 % (ref 36.0–46.0)
Hemoglobin: 13.8 g/dL (ref 12.0–15.0)
Hemoglobin: 13.8 g/dL (ref 12.0–15.0)
Immature Granulocytes: 1 %
Immature Granulocytes: 0 %
Lymphocytes Relative: 2 %
Lymphocytes Relative: 3 %
Lymphs Abs: 0.3 10*3/uL — ABNORMAL LOW (ref 0.7–4.0)
Lymphs Abs: 0.3 K/uL — ABNORMAL LOW (ref 0.7–4.0)
MCH: 36.6 pg — ABNORMAL HIGH (ref 26.0–34.0)
MCH: 36.1 pg — ABNORMAL HIGH (ref 26.0–34.0)
MCHC: 34.1 g/dL (ref 30.0–36.0)
MCHC: 33.7 g/dL (ref 30.0–36.0)
MCV: 107.4 fL — ABNORMAL HIGH (ref 80.0–100.0)
MCV: 107.1 fL — ABNORMAL HIGH (ref 80.0–100.0)
Monocytes Absolute: 1.1 10*3/uL — ABNORMAL HIGH (ref 0.1–1.0)
Monocytes Absolute: 0.6 K/uL (ref 0.1–1.0)
Monocytes Relative: 10 %
Monocytes Relative: 6 %
Neutro Abs: 9.6 10*3/uL — ABNORMAL HIGH (ref 1.7–7.7)
Neutro Abs: 8.6 K/uL — ABNORMAL HIGH (ref 1.7–7.7)
Neutrophils Relative %: 87 %
Neutrophils Relative %: 91 %
Platelets: 197 10*3/uL (ref 150–400)
Platelets: 170 K/uL (ref 150–400)
RBC: 3.77 MIL/uL — ABNORMAL LOW (ref 3.87–5.11)
RBC: 3.82 MIL/uL — ABNORMAL LOW (ref 3.87–5.11)
RDW: 12.9 % (ref 11.5–15.5)
RDW: 13 % (ref 11.5–15.5)
WBC: 11 10*3/uL — ABNORMAL HIGH (ref 4.0–10.5)
WBC: 9.5 K/uL (ref 4.0–10.5)
nRBC: 0.5 % — ABNORMAL HIGH (ref 0.0–0.2)
nRBC: 0.3 % — ABNORMAL HIGH (ref 0.0–0.2)

## 2023-08-07 LAB — I-STAT VENOUS BLOOD GAS, ED
Acid-base deficit: 17 mmol/L — ABNORMAL HIGH (ref 0.0–2.0)
Bicarbonate: 8.2 mmol/L — ABNORMAL LOW (ref 20.0–28.0)
Calcium, Ion: 1.33 mmol/L (ref 1.15–1.40)
HCT: 41 % (ref 36.0–46.0)
Hemoglobin: 13.9 g/dL (ref 12.0–15.0)
O2 Saturation: 64 %
Potassium: 3.6 mmol/L (ref 3.5–5.1)
Sodium: 132 mmol/L — ABNORMAL LOW (ref 135–145)
TCO2: 9 mmol/L — ABNORMAL LOW (ref 22–32)
pCO2, Ven: 19.7 mmHg — CL (ref 44–60)
pH, Ven: 7.226 — ABNORMAL LOW (ref 7.25–7.43)
pO2, Ven: 38 mmHg (ref 32–45)

## 2023-08-07 LAB — MAGNESIUM
Magnesium: 1.8 mg/dL (ref 1.7–2.4)
Magnesium: 1.2 mg/dL — ABNORMAL LOW (ref 1.7–2.4)

## 2023-08-07 LAB — BASIC METABOLIC PANEL WITH GFR
Anion gap: 17 — ABNORMAL HIGH (ref 5–15)
Anion gap: 13 (ref 5–15)
BUN: 6 mg/dL (ref 6–20)
BUN: 5 mg/dL — ABNORMAL LOW (ref 6–20)
BUN: 5 mg/dL — ABNORMAL LOW (ref 6–20)
CO2: <7 mmol/L — ABNORMAL LOW (ref 22–32)
CO2: 8 mmol/L — ABNORMAL LOW (ref 22–32)
CO2: 13 mmol/L — ABNORMAL LOW (ref 22–32)
Calcium: 9 mg/dL (ref 8.9–10.3)
Calcium: 8.7 mg/dL — ABNORMAL LOW (ref 8.9–10.3)
Calcium: 9.4 mg/dL (ref 8.9–10.3)
Chloride: 98 mmol/L (ref 98–111)
Chloride: 106 mmol/L (ref 98–111)
Chloride: 105 mmol/L (ref 98–111)
Creatinine, Ser: 1.26 mg/dL — ABNORMAL HIGH (ref 0.44–1.00)
Creatinine, Ser: 0.9 mg/dL (ref 0.44–1.00)
Creatinine, Ser: 0.84 mg/dL (ref 0.44–1.00)
GFR, Estimated: 57 mL/min — ABNORMAL LOW (ref >60)
GFR, Estimated: >60 mL/min
GFR, Estimated: >60 mL/min (ref >60)
Glucose, Bld: 269 mg/dL — ABNORMAL HIGH (ref 70–99)
Glucose, Bld: 211 mg/dL — ABNORMAL HIGH (ref 70–99)
Glucose, Bld: 219 mg/dL — ABNORMAL HIGH (ref 70–99)
Potassium: 3.4 mmol/L — ABNORMAL LOW (ref 3.5–5.1)
Potassium: 5.1 mmol/L (ref 3.5–5.1)
Potassium: 3.1 mmol/L — ABNORMAL LOW (ref 3.5–5.1)
Sodium: 130 mmol/L — ABNORMAL LOW (ref 135–145)
Sodium: 131 mmol/L — ABNORMAL LOW (ref 135–145)
Sodium: 131 mmol/L — ABNORMAL LOW (ref 135–145)

## 2023-08-07 LAB — URINALYSIS, W/ REFLEX TO CULTURE (INFECTION SUSPECTED)
Bilirubin Urine: NEGATIVE
Glucose, UA: 50 mg/dL — AB
Ketones, ur: 80 mg/dL — AB
Leukocytes,Ua: NEGATIVE
Nitrite: NEGATIVE
Protein, ur: 100 mg/dL — AB
Specific Gravity, Urine: 1.032 — ABNORMAL HIGH (ref 1.005–1.030)
pH: 6 (ref 5.0–8.0)

## 2023-08-07 LAB — CBG MONITORING, ED
Glucose-Capillary: 269 mg/dL — ABNORMAL HIGH (ref 70–99)
Glucose-Capillary: 250 mg/dL — ABNORMAL HIGH (ref 70–99)
Glucose-Capillary: 207 mg/dL — ABNORMAL HIGH (ref 70–99)
Glucose-Capillary: 203 mg/dL — ABNORMAL HIGH (ref 70–99)
Glucose-Capillary: 203 mg/dL — ABNORMAL HIGH (ref 70–99)
Glucose-Capillary: 173 mg/dL — ABNORMAL HIGH (ref 70–99)

## 2023-08-07 LAB — GLUCOSE, CAPILLARY
Glucose-Capillary: 175 mg/dL — ABNORMAL HIGH (ref 70–99)
Glucose-Capillary: 184 mg/dL — ABNORMAL HIGH (ref 70–99)
Glucose-Capillary: 190 mg/dL — ABNORMAL HIGH (ref 70–99)
Glucose-Capillary: 203 mg/dL — ABNORMAL HIGH (ref 70–99)
Glucose-Capillary: 203 mg/dL — ABNORMAL HIGH (ref 70–99)
Glucose-Capillary: 225 mg/dL — ABNORMAL HIGH (ref 70–99)

## 2023-08-07 LAB — COMPREHENSIVE METABOLIC PANEL WITH GFR
ALT: 133 U/L — ABNORMAL HIGH (ref 0–44)
AST: 417 U/L — ABNORMAL HIGH (ref 15–41)
Albumin: 4.6 g/dL (ref 3.5–5.0)
Alkaline Phosphatase: 151 U/L — ABNORMAL HIGH (ref 38–126)
BUN: 6 mg/dL (ref 6–20)
CO2: <7 mmol/L — ABNORMAL LOW (ref 22–32)
Calcium: 9.9 mg/dL (ref 8.9–10.3)
Chloride: 92 mmol/L — ABNORMAL LOW (ref 98–111)
Creatinine, Ser: 1.46 mg/dL — ABNORMAL HIGH (ref 0.44–1.00)
GFR, Estimated: 48 mL/min — ABNORMAL LOW (ref >60)
Glucose, Bld: 326 mg/dL — ABNORMAL HIGH (ref 70–99)
Potassium: 3.2 mmol/L — ABNORMAL LOW (ref 3.5–5.1)
Sodium: 131 mmol/L — ABNORMAL LOW (ref 135–145)
Total Bilirubin: 4.7 mg/dL — ABNORMAL HIGH (ref 0.0–1.2)
Total Protein: 8.9 g/dL — ABNORMAL HIGH (ref 6.5–8.1)

## 2023-08-07 LAB — I-STAT CG4 LACTIC ACID, ED
Lactic Acid, Venous: 2.2 mmol/L (ref 0.5–1.9)
Lactic Acid, Venous: 2.5 mmol/L (ref 0.5–1.9)

## 2023-08-07 LAB — BETA-HYDROXYBUTYRIC ACID
Beta-Hydroxybutyric Acid: >8 mmol/L — ABNORMAL HIGH (ref 0.05–0.27)
Beta-Hydroxybutyric Acid: 5.56 mmol/L — ABNORMAL HIGH (ref 0.05–0.27)
Beta-Hydroxybutyric Acid: 3.58 mmol/L — ABNORMAL HIGH (ref 0.05–0.27)

## 2023-08-07 LAB — HCG, SERUM, QUALITATIVE
Preg, Serum: NEGATIVE
Preg, Serum: NEGATIVE

## 2023-08-07 LAB — HIV ANTIBODY (ROUTINE TESTING W REFLEX): HIV Screen 4th Generation wRfx: NONREACTIVE

## 2023-08-07 LAB — PROTIME-INR
INR: 1.1 (ref 0.8–1.2)
Prothrombin Time: 14.8 s (ref 11.4–15.2)

## 2023-08-07 LAB — HEMOGLOBIN A1C
Hgb A1c MFr Bld: 4 % — ABNORMAL LOW (ref 4.8–5.6)
Mean Plasma Glucose: 68.1 mg/dL

## 2023-08-07 LAB — LIPASE, BLOOD: Lipase: 1710 U/L — ABNORMAL HIGH (ref 11–51)

## 2023-08-07 LAB — TRIGLYCERIDES: Triglycerides: 192 mg/dL — ABNORMAL HIGH (ref <150)

## 2023-08-07 LAB — AMMONIA: Ammonia: 78 umol/L — ABNORMAL HIGH (ref 9–35)

## 2023-08-07 MED ORDER — SODIUM CHLORIDE 0.9% FLUSH: 3.0000 mL | INTRAVENOUS | Status: DC | PRN

## 2023-08-07 MED ORDER — NICOTINE 21 MG/24HR TD PT24
21.0000 mg | MEDICATED_PATCH | Freq: Every day | TRANSDERMAL | Status: DC
  Administered 2023-08-07 – 2023-08-08 (×2): 21 mg via TRANSDERMAL
  Filled 2023-08-07 (×4): qty 1

## 2023-08-07 MED ORDER — SODIUM CHLORIDE 0.9% FLUSH
3.0000 mL | Freq: Two times a day (BID) | INTRAVENOUS | Status: DC
  Administered 2023-08-07 – 2023-08-08 (×2): 3 mL via INTRAVENOUS
  Administered 2023-08-08: 10 mL via INTRAVENOUS
  Administered 2023-08-09 – 2023-08-11 (×5): 3 mL via INTRAVENOUS

## 2023-08-07 MED ORDER — LORAZEPAM 2 MG/ML IJ SOLN: 1.0000 mg | Freq: Once | INTRAMUSCULAR | Status: DC

## 2023-08-07 MED ORDER — THIAMINE HCL 100 MG/ML IJ SOLN
500.0000 mg | Freq: Every day | INTRAVENOUS | Status: DC
  Administered 2023-08-07: 500 mg via INTRAVENOUS
  Filled 2023-08-07 (×2): qty 5

## 2023-08-07 MED ORDER — DEXTROSE IN LACTATED RINGERS 5 % IV SOLN: INTRAVENOUS | Status: DC

## 2023-08-07 MED ORDER — ACETAMINOPHEN 325 MG PO TABS: 650.0000 mg | ORAL_TABLET | Freq: Four times a day (QID) | ORAL | Status: DC | PRN

## 2023-08-07 MED ORDER — POTASSIUM CHLORIDE 10 MEQ/100ML IV SOLN
10.0000 meq | INTRAVENOUS | Status: AC
  Administered 2023-08-07 (×4): 10 meq via INTRAVENOUS
  Filled 2023-08-07 (×4): qty 100

## 2023-08-07 MED ORDER — LACTATED RINGERS IV BOLUS
1000.0000 mL | Freq: Once | INTRAVENOUS | Status: AC
  Administered 2023-08-07: 1000 mL via INTRAVENOUS

## 2023-08-07 MED ORDER — ACETAMINOPHEN 650 MG RE SUPP: 650.0000 mg | Freq: Four times a day (QID) | RECTAL | Status: DC | PRN

## 2023-08-07 MED ORDER — SODIUM CHLORIDE 0.9% FLUSH
3.0000 mL | Freq: Two times a day (BID) | INTRAVENOUS | Status: DC
  Administered 2023-08-07 – 2023-08-11 (×8): 3 mL via INTRAVENOUS

## 2023-08-07 MED ORDER — LORAZEPAM 2 MG/ML IJ SOLN
2.0000 mg | Freq: Once | INTRAMUSCULAR | Status: AC
  Administered 2023-08-07: 2 mg via INTRAVENOUS
  Filled 2023-08-07: qty 1

## 2023-08-07 MED ORDER — HEPARIN SODIUM (PORCINE) 5000 UNIT/ML IJ SOLN
5000.0000 [IU] | Freq: Two times a day (BID) | INTRAMUSCULAR | Status: DC
  Administered 2023-08-07 – 2023-08-12 (×10): 5000 [IU] via SUBCUTANEOUS
  Filled 2023-08-07 (×10): qty 1

## 2023-08-07 MED ORDER — FENTANYL CITRATE PF 50 MCG/ML IJ SOSY
50.0000 ug | PREFILLED_SYRINGE | Freq: Once | INTRAMUSCULAR | Status: AC
  Administered 2023-08-07: 50 ug via INTRAVENOUS
  Filled 2023-08-07: qty 1

## 2023-08-07 MED ORDER — HYDROCODONE-ACETAMINOPHEN 5-325 MG PO TABS
1.0000 | ORAL_TABLET | ORAL | Status: DC | PRN
  Administered 2023-08-07 – 2023-08-10 (×3): 1 via ORAL
  Filled 2023-08-07 (×3): qty 1

## 2023-08-07 MED ORDER — CHLORDIAZEPOXIDE HCL 5 MG PO CAPS
10.0000 mg | ORAL_CAPSULE | Freq: Four times a day (QID) | ORAL | Status: DC
  Administered 2023-08-07 (×2): 10 mg via ORAL
  Filled 2023-08-07 (×2): qty 2

## 2023-08-07 MED ORDER — POTASSIUM CHLORIDE CRYS ER 20 MEQ PO TBCR
40.0000 meq | EXTENDED_RELEASE_TABLET | Freq: Once | ORAL | Status: AC
  Administered 2023-08-07: 40 meq via ORAL
  Filled 2023-08-07: qty 2

## 2023-08-07 MED ORDER — ONDANSETRON HCL 4 MG/2ML IJ SOLN
4.0000 mg | Freq: Once | INTRAMUSCULAR | Status: AC
  Administered 2023-08-07: 4 mg via INTRAVENOUS
  Filled 2023-08-07: qty 2

## 2023-08-07 MED ORDER — DEXTROSE 50 % IV SOLN: 0.0000 mL | INTRAVENOUS | Status: DC | PRN

## 2023-08-07 MED ORDER — LACTATED RINGERS IV SOLN: INTRAVENOUS | Status: DC

## 2023-08-07 MED ORDER — PANTOPRAZOLE SODIUM 40 MG IV SOLR
80.0000 mg | Freq: Once | INTRAVENOUS | Status: AC
  Administered 2023-08-07: 80 mg via INTRAVENOUS
  Filled 2023-08-07: qty 20

## 2023-08-07 MED ORDER — INSULIN REGULAR(HUMAN) IN NACL 100-0.9 UT/100ML-% IV SOLN
INTRAVENOUS | Status: DC
  Administered 2023-08-07: 4 [IU]/h via INTRAVENOUS
  Filled 2023-08-07: qty 100

## 2023-08-07 MED ORDER — MORPHINE SULFATE (PF) 2 MG/ML IV SOLN
2.0000 mg | INTRAVENOUS | Status: DC | PRN
  Administered 2023-08-07 – 2023-08-09 (×2): 2 mg via INTRAVENOUS
  Filled 2023-08-07 (×2): qty 1

## 2023-08-07 MED ORDER — THIAMINE MONONITRATE 100 MG PO TABS
100.0000 mg | ORAL_TABLET | Freq: Every day | ORAL | Status: DC
  Administered 2023-08-08: 100 mg via ORAL
  Filled 2023-08-07: qty 1

## 2023-08-07 MED ORDER — IOHEXOL 350 MG/ML SOLN
75.0000 mL | Freq: Once | INTRAVENOUS | Status: AC | PRN
  Administered 2023-08-07: 75 mL via INTRAVENOUS

## 2023-08-07 NOTE — H&P (Addendum)
 History and Physical    Patient: Angela Palmer JWJ:[id number] DOB: September 30, 1985 DOA: 08/07/2023 DOS: the patient was seen and examined on 08/07/2023 PCP: Patient, No Pcp Per  Patient coming from: Home Chief complaint: Chief Complaint  Patient presents with   Nausea   Emesis   HPI:  Angela Palmer is a 38 y.o. female with past medical history  of alcohol abuse and tobacco abuse, allergy to minocycline, history of seizures suspect secondary to alcohol withdrawal, patient's last drink was a few days ago.pt drinks whiskey few days a week and come with nausea , vomiting and abdominal pain.  ED Course: Pt in ed at bedside  is thin, A/O and   Vital signs in the ED were notable for the following:  Vitals:   08/07/23 1615 08/07/23 1630 08/07/23 1645 08/07/23 1700  BP: (!) 119/98 (!) 122/93 (!) 121/91   Pulse: (!) 120 (!) 112 (!) 122   Temp:    98 F (36.7 C)  Resp: (!) 33 (!) 25 (!) 25   Height:      Weight:      SpO2: 100% 100% 100%   TempSrc:    Oral  BMI (Calculated):      >>ED evaluation thus far shows: CMP shows hyponatremia of 131 hypokalemia 3.2 bicarb of less than 7 chloride of 92 glucose 326 AKI of 1.46 EGFR 48. Magnesium  1.8 alk phos 151 lipase 1710 AST 417 ALT 133 total protein 8.9 total bili 4.7.  Ammonia of 78. CBC showing white count of 11 normal hemoglobin MCV of 107.4 platelets of 197 Beta hydroxy more than 8 glucose 326.   Urinalysis does show protein and glucose. Alcohol level less than 10. CT shows acute uncomplicated pancreatitis and severe hepatic steatosis and hepatomegaly. Right upper quadrant ultrasound: Pending Lipid panel: Pending  >>While in the ED patient received the following: Medications  insulin  regular, human (MYXREDLIN ) 100 units/ 100 mL infusion (2.6 Units/hr Intravenous Infusion Verify 08/07/23 1653)  lactated ringers  infusion (0 mLs Intravenous Stopped 08/07/23 1409)  dextrose  5 % in lactated ringers  infusion ( Intravenous New Bag/Given  08/07/23 1409)  dextrose  50 % solution 0-50 mL (has no administration in time range)  potassium chloride  10 mEq in 100 mL IVPB (10 mEq Intravenous New Bag/Given 08/07/23 1652)  thiamine  (VITAMIN B1) 500 mg in sodium chloride  0.9 % 50 mL IVPB (has no administration in time range)  chlordiazePOXIDE  (LIBRIUM ) capsule 10 mg (has no administration in time range)  heparin  injection 5,000 Units (has no administration in time range)  sodium chloride  flush (NS) 0.9 % injection 3 mL (has no administration in time range)  acetaminophen  (TYLENOL ) tablet 650 mg (has no administration in time range)    Or  acetaminophen  (TYLENOL ) suppository 650 mg (has no administration in time range)  HYDROcodone -acetaminophen  (NORCO/VICODIN) 5-325 MG per tablet 1 tablet (has no administration in time range)  morphine  (PF) 2 MG/ML injection 2 mg (has no administration in time range)  sodium chloride  flush (NS) 0.9 % injection 3-10 mL (has no administration in time range)  sodium chloride  flush (NS) 0.9 % injection 3-10 mL (has no administration in time range)  nicotine  (NICODERM CQ  - dosed in mg/24 hours) patch 21 mg (has no administration in time range)  thiamine  (VITAMIN B1) tablet 100 mg (has no administration in time range)  lactated ringers  bolus 1,000 mL (0 mLs Intravenous Stopped 08/07/23 1029)  ondansetron  (ZOFRAN ) injection 4 mg (4 mg Intravenous Given 08/07/23 0902)  fentaNYL  (SUBLIMAZE ) injection 50  mcg (50 mcg Intravenous Given 08/07/23 1012)  LORazepam  (ATIVAN ) injection 2 mg (2 mg Intravenous Given 08/07/23 1012)  pantoprazole  (PROTONIX ) injection 80 mg (80 mg Intravenous Given 08/07/23 1013)  iohexol  (OMNIPAQUE ) 350 MG/ML injection 75 mL (75 mLs Intravenous Contrast Given 08/07/23 1223)  lactated ringers  bolus 1,000 mL (0 mLs Intravenous Stopped 08/07/23 1417)     Review of Systems  Constitutional:  Positive for malaise/fatigue.  Gastrointestinal:  Positive for abdominal pain, nausea and vomiting.   Neurological:  Positive for weakness.   Past Medical History:  Diagnosis Date   Alcohol abuse    History reviewed. No pertinent surgical history.  reports that she has been smoking cigarettes. She has never used smokeless tobacco. She reports current alcohol use. She reports that she does not use drugs. Allergies  Allergen Reactions   Minocycline Swelling    Swelling of the face    Family History  Problem Relation Age of Onset   Hypertension Mother    Prior to Admission medications   Medication Sig Start Date End Date Taking? Authorizing Provider  cetirizine  (ZYRTEC ) 10 MG tablet Take 1 tablet (10 mg total) by mouth daily. 07/17/21   Wilhemena Harbour, NP  chlordiazePOXIDE  (LIBRIUM ) 25 MG capsule Take 1 caplet (25 mg) twice today (6pm, 10 pm), 3 times on Saturday (8am,12pm,5pm), twice on Sunday (8am, 5pm) and then once (8am) on Monday 08/04/19   Amber Bail, MD  chlordiazePOXIDE  (LIBRIUM ) 25 MG capsule 50mg  PO TID x 1D, then 25-50mg  PO BID X 1D, then 25-50mg  PO QD X 1D 09/28/22   Tegeler, Marine Sia, MD  diphenhydrAMINE  (BENADRYL ) 25 MG tablet Take 1-2 tablets (25-50 mg total) by mouth at bedtime as needed for itching. 07/17/21   Wilhemena Harbour, NP  folic acid  (FOLVITE ) 1 MG tablet Take 1 tablet (1 mg total) by mouth daily. 06/28/19   Melodye Spurr, MD  levonorgestrel -ethinyl estradiol (ALESSE) 0.1-20 MG-MCG tablet Take 1 tablet by mouth daily. 06/28/19   Melodye Spurr, MD  predniSONE  (DELTASONE ) 10 MG tablet Take 6 tablets by mouth daily for 2 days, then reduce by 1 tablet every 2 days until gone 07/17/21   Wilhemena Harbour, NP                                                                                 Vitals:   08/07/23 1615 08/07/23 1630 08/07/23 1645 08/07/23 1700  BP: (!) 119/98 (!) 122/93 (!) 121/91   Pulse: (!) 120 (!) 112 (!) 122   Resp: (!) 33 (!) 25 (!) 25   Temp:    98 F (36.7 C)  TempSrc:    Oral  SpO2: 100% 100% 100%   Weight:      Height:        Physical Exam Constitutional:      General: She is not in acute distress.    Appearance: She is underweight.  HENT:     Head: Atraumatic.  Cardiovascular:     Rate and Rhythm: Regular rhythm. Tachycardia present.     Heart sounds: Normal heart sounds.  Pulmonary:     Effort: Pulmonary effort is normal.     Breath sounds: Normal breath sounds.  Abdominal:     General: Bowel sounds are normal. There is no distension.     Palpations: Abdomen is soft.     Tenderness: There is abdominal tenderness. There is guarding.  Musculoskeletal:     Right lower leg: No edema.     Left lower leg: No edema.  Skin:    General: Skin is dry.  Neurological:     General: No focal deficit present.     Mental Status: She is alert and oriented to person, place, and time.     Labs on Admission: I have personally reviewed following labs and imaging studies CBC: Recent Labs  Lab 08/07/23 0852 08/07/23 1326 08/07/23 1515  WBC 11.0* 9.5  --   NEUTROABS 9.6* 8.6*  --   HGB 13.8 13.8 13.9  HCT 40.5 40.9 41.0  MCV 107.4* 107.1*  --   PLT 197 170  --    Basic Metabolic Panel: Recent Labs  Lab 08/07/23 0852 08/07/23 1326 08/07/23 1515  NA 131* 130* 132*  K 3.2* 3.4* 3.6  CL 92* 98  --   CO2 <7* <7*  --   GLUCOSE 326* 269*  --   BUN 6 6  --   CREATININE 1.46* 1.26*  --   CALCIUM 9.9 9.0  --   MG 1.8  --   --    GFR: Estimated Creatinine Clearance: 53.5 mL/min (A) (by C-G formula based on SCr of 1.26 mg/dL (H)). Liver Function Tests: Recent Labs  Lab 08/07/23 0852  AST 417*  ALT 133*  ALKPHOS 151*  BILITOT 4.7*  PROT 8.9*  ALBUMIN 4.6   Recent Labs  Lab 08/07/23 0852  LIPASE 1,710*   Recent Labs  Lab 08/07/23 1326  AMMONIA 78*   Coagulation Profile: Recent Labs  Lab 08/07/23 1326  INR 1.1   Cardiac Enzymes: No results for input(s): "CKTOTAL", "CKMB", "CKMBINDEX", "TROPONINI" in the last 168 hours. BNP (last 3 results) No results for input(s): "PROBNP" in the  last 8760 hours. HbA1C: Recent Labs    08/07/23 1326  HGBA1C 4.0*   CBG: Recent Labs  Lab 08/07/23 1311 08/07/23 1334 08/07/23 1439 08/07/23 1540 08/07/23 1652  GLUCAP 269* 250* 207* 203* 203*   Lipid Profile: No results for input(s): "CHOL", "HDL", "LDLCALC", "TRIG", "CHOLHDL", "LDLDIRECT" in the last 72 hours. Thyroid  Function Tests: No results for input(s): "TSH", "T4TOTAL", "FREET4", "T3FREE", "THYROIDAB" in the last 72 hours. Anemia Panel: No results for input(s): "VITAMINB12", "FOLATE", "FERRITIN", "TIBC", "IRON", "RETICCTPCT" in the last 72 hours. Urine analysis:    Component Value Date/Time   COLORURINE YELLOW 08/07/2023 1255   APPEARANCEUR CLEAR 08/07/2023 1255   LABSPEC 1.032 (H) 08/07/2023 1255   PHURINE 6.0 08/07/2023 1255   GLUCOSEU 50 (A) 08/07/2023 1255   HGBUR SMALL (A) 08/07/2023 1255   BILIRUBINUR NEGATIVE 08/07/2023 1255   KETONESUR 80 (A) 08/07/2023 1255   PROTEINUR 100 (A) 08/07/2023 1255   NITRITE NEGATIVE 08/07/2023 1255   LEUKOCYTESUR NEGATIVE 08/07/2023 1255   Radiological Exams on Admission: CT ABDOMEN PELVIS W CONTRAST Result Date: 08/07/2023 CLINICAL DATA:  Abdominal pain, acute, nonlocalized EXAM: CT ABDOMEN AND PELVIS WITH CONTRAST TECHNIQUE: Multidetector CT imaging of the abdomen and pelvis was performed using the standard protocol following bolus administration of intravenous contrast. RADIATION DOSE REDUCTION: This exam was performed according to the departmental dose-optimization program which includes automated exposure control, adjustment of the mA and/or kV according to patient size and/or use of iterative reconstruction technique. CONTRAST:  75mL OMNIPAQUE  IOHEXOL  350  MG/ML SOLN COMPARISON:  Ultrasound 06/26/2019 FINDINGS: Lower chest: Included lung bases are clear.  Heart size is normal. Hepatobiliary: Severe hepatic steatosis. Liver is enlarged measuring approximately 23 cm in length. No focal liver lesion identified. Unremarkable  gallbladder. No hyperdense gallstone. No biliary dilatation. Pancreas: Pancreatic tail appears enlarged and edematous. There is peripancreatic fluid adjacent to the pancreatic tail. No organized or walled-off collections. No focal area of parenchymal non enhancement to suggest necrosis. No ductal dilatation. Spleen: Normal in size without focal abnormality. Adrenals/Urinary Tract: Unremarkable adrenal glands. Kidneys enhance symmetrically without focal lesion, stone, or hydronephrosis. Ureters are nondilated. Urinary bladder appears unremarkable for the degree of distention. Stomach/Bowel: Stomach is within normal limits. No evidence of bowel wall thickening, distention, or inflammatory changes. Vascular/Lymphatic: No significant vascular findings are present. No enlarged abdominal or pelvic lymph nodes. Reproductive: Uterus and bilateral adnexa are unremarkable. Other: No pneumoperitoneum.  No abdominal wall hernia. Musculoskeletal: No acute or significant osseous findings. IMPRESSION: 1. Findings compatible with acute uncomplicated pancreatitis. 2. Severe hepatic steatosis and hepatomegaly. Electronically Signed   By: Leverne Reading D.O.   On: 08/07/2023 15:30   Data Reviewed: Relevant notes from primary care and specialist visits, past discharge summaries as available in EHR, including Care Everywhere. Prior diagnostic testing as pertinent to current admission diagnoses, Updated medications and problem lists for reconciliation ED course, including vitals, labs, imaging, treatment and response to treatment,Triage notes, nursing and pharmacy notes and ED provider's notes Notable results as noted in HPI.Discussed case with EDMD/ ED APP/ or Specialty MD on call and as needed.  Assessment & Plan Nausea & vomiting Pt presenting with intermittent nausea and vomiting more persistent over the past day or 2 and 6 times yesterday.  Secondary to pancreatitis, DKA, possible gastritis esophagitis underlying PUD.   Supportive care with IV fluids IV PPI.  Antiemetics monitoring QT intervals and electrolytes. Seizure (HCC) Alcohol withdrawal seizures, seizure precautions.  No AEDs in chart.  Hypokalemia H/O Hypokalemia , Will replace and follow levels. Transaminitis    Latest Ref Rng & Units 08/07/2023    8:52 AM 09/28/2022   10:25 AM 08/04/2019    2:37 AM  Hepatic Function  Total Protein 6.5 - 8.1 g/dL 8.9  8.5  6.4   Albumin 3.5 - 5.0 g/dL 4.6  4.6  3.5   AST 15 - 41 U/L 417  53  160   ALT 0 - 44 U/L 133  30  60   Alk Phosphatase 38 - 126 U/L 151  50  88   Total Bilirubin 0.0 - 1.2 mg/dL 4.7  1.3  0.8    Secondary to alcoholic liver disease. Will follow trends, right upper quadrant ultrasound requested.  Hypomagnesemia H/O hypomag and 2nd to ETOH , Will replace and follow levels. High anion gap metabolic acidosis Differentials include alcoholic ketoacidosis, DKA, lactic acidosis. Patient also has elevated beta hydroxy. Started on DKA protocol and insulin  gtt.  Alcohol abuse Patient admitted to stepdown with withdrawal protocol, thiamine , CIWA, aspiration, fall, seizure precautions. TOC for substance abuse counselor referral. Scheduled librium .  Alcohol induced acute pancreatitis Triglycerides level pending.  Alcohol level less than 10 currently. Supportive care with IV fluid IV PPI aggressive hydration.  Hyperglycemia No known history of diabetes suspect this may be type I or hyperglycemia secondary to pancreatic insufficiency the patient may be developing secondary to her history of pancreatitis. Moderate episode of recurrent major depressive disorder [facility]) Patient will benefit from antidepressants and antianxiety agents consider starting once LFTs  have returned to baseline. Abdominal pain 2/2 to alcoholic pancreatitis.  PRN morphine .  IV PPI. AKI (acute kidney injury) [facility] Lab Results  Component Value Date   CREATININE 1.26 (H) 08/07/2023   CREATININE 1.46 (H) 08/07/2023    CREATININE 0.55 09/28/2022  Cont with IVF hydration. Avoid contrast.    DVT prophylaxis:  Heparin   Consults:  None  Advance Care Planning:    Code Status: Full Code   Family Communication:  None  Disposition Plan:  Home  Severity of Illness: The appropriate patient status for this patient is INPATIENT. Inpatient status is judged to be reasonable and necessary in order to provide the required intensity of service to ensure the patient's safety. The patient's presenting symptoms, physical exam findings, and initial radiographic and laboratory data in the context of their chronic comorbidities is felt to place them at high risk for further clinical deterioration. Furthermore, it is not anticipated that the patient will be medically stable for discharge from the hospital within 2 midnights of admission.   * I certify that at the point of admission it is my clinical judgment that the patient will require inpatient hospital care spanning beyond 2 midnights from the point of admission due to high intensity of service, high risk for further deterioration and high frequency of surveillance required.*  Unresulted Labs (From admission, onward)     Start     Ordered   08/08/23 0500  Comprehensive metabolic panel  Tomorrow morning,   R        08/07/23 1734   08/08/23 0500  CBC  Tomorrow morning,   R        08/07/23 1734   08/07/23 1730  Magnesium   Add-on,   AD        08/07/23 1730   08/07/23 1727  HIV Antibody (routine testing w rflx)  (HIV Antibody (Routine testing w reflex) panel)  Once,   R        08/07/23 1734   08/07/23 1559  Triglycerides  Once,   URGENT        08/07/23 1559   08/07/23 1300  Basic metabolic panel  (Diabetes Ketoacidosis (DKA))  STAT Now then every 4 hours ,   STAT      08/07/23 1301   08/07/23 1300  Beta-hydroxybutyric acid  (Diabetes Ketoacidosis (DKA))  Now then every 4 hours,   STAT      08/07/23 1301   08/07/23 1255  Urine Culture  Once,   R        08/07/23 1255             Orders Placed This Encounter  Procedures   Urine Culture   CT ABDOMEN PELVIS W CONTRAST   US  Abdomen Limited RUQ (LIVER/GB)   CBC with Differential   Comprehensive metabolic panel   Lipase, blood   hCG, serum, qualitative   Urinalysis, w/ Reflex to Culture (Infection Suspected) -Urine, Clean Catch   Magnesium    Beta-hydroxybutyric acid   Protime-INR   Ammonia   hCG, serum, qualitative   Basic metabolic panel   Beta-hydroxybutyric acid   CBC with Differential (PNL)   Hemoglobin A1c   Triglycerides   Magnesium    HIV Antibody (routine testing w rflx)   Comprehensive metabolic panel   CBC   Diet NPO time specified   ED Cardiac monitoring   Initiate Carrier Fluid Protocol   Notify physician (specify)   If present, discontinue Insulin  Pump after IV Insulin  is initiated.   Do NOT  use lab glucose values in [employer].  If CBG meter reads "Critical High", enter 600.   IV insulin  infusion with sufficient glucose should be continued until MD determines acidosis is corrected and places transition orders.   Upon IV fluid bolus completion, place order for STAT BMET (LAB15) and call provider with results.   Maintain IV access   Vital signs   Notify physician (specify)   Mobility Protocol: No Restrictions RN to initiate protocols based on patient's level of care   Refer to Sidebar Report Refer to ICU, Med-Surg, Progressive, and Step-Down Mobility Protocol Sidebars   Initiate Adult Central Line Maintenance and Catheter Protocol for patients with central line (CVC, PICC, Port, Hemodialysis, Trialysis)   Daily weights   Intake and Output   Do not place and if present remove PureWick   Initiate Oral Care Protocol   Initiate Carrier Fluid Protocol   RN may order General Admission PRN Orders utilizing "General Admission PRN medications" (through manage orders) for the following patient needs: allergy symptoms (Claritin), cold sores (Carmex), cough (Robitussin DM), eye irritation  (Liquifilm Tears), hemorrhoids (Tucks), indigestion (Maalox), minor skin irritation (Hydrocortisone Cream), muscle pain Lovena Rubinstein Gay), nose irritation (saline nasal spray) and sore throat (Chloraseptic spray).   Full code   Consult for [facility] Admission   Pulse oximetry check with vital signs   Oxygen therapy Mode or (Route): Nasal cannula; Liters Per Minute: 2; Keep O2 saturation between: greater than 92 %   I-Stat venous blood gas, ([facility] ED, [facility], [facility])   I-Stat CG4 Lactic Acid   CBG monitoring, ED   CBG monitoring, ED   CBG monitoring, ED   CBG monitoring, ED   CBG monitoring, ED   CBG monitoring, ED   ED EKG   EKG 12-Lead   Insert peripheral IV   Admit to Inpatient (patient's expected length of stay will be greater than 2 midnights or inpatient only procedure)   Aspiration precautions   Fall precautions   Seizure precautions    Author: Lavanda Porter, MD 12 pm -8 pm. 08/07/2023 5:34 PM >>Please note for any concern,or critical results after hours past 8pm please contact the [facility] hospitalist [facility] floor coverage provider from 7 PM- 7 AM. For on call review [email or web address], username [email or web address] and PW: your phone number<<

## 2023-08-07 NOTE — Assessment & Plan Note (Addendum)
    Latest Ref Rng & Units 08/07/2023    8:52 AM 09/28/2022   10:25 AM 08/04/2019    2:37 AM  Hepatic Function  Total Protein 6.5 - 8.1 g/dL 8.9  8.5  6.4   Albumin 3.5 - 5.0 g/dL 4.6  4.6  3.5   AST 15 - 41 U/L 417  53  160   ALT 0 - 44 U/L 133  30  60   Alk Phosphatase 38 - 126 U/L 151  50  88   Total Bilirubin 0.0 - 1.2 mg/dL 4.7  1.3  0.8    Secondary to alcoholic liver disease. Will follow trends, right upper quadrant ultrasound requested.

## 2023-08-07 NOTE — Assessment & Plan Note (Addendum)
 No known history of diabetes suspect this may be type I or hyperglycemia secondary to pancreatic insufficiency the patient may be developing secondary to her history of pancreatitis.

## 2023-08-07 NOTE — ED Notes (Signed)
 Hospitalist at bedside

## 2023-08-07 NOTE — ED Triage Notes (Signed)
 Patient arrives [facility] from home for nausea vomiting x 1 week, severe abdominal pain beginning yesterday. CBG 364. Alcohol dependent, usually drinks whiskey a couple days a week per patient, last drink 2 days ago. Poor PO intake x2 weeks. Alert and oriented x4.   120 HR 130/80 BP 100 on room air 22 L hand- 150 cc en route 50 fentanyl  with moderate relief

## 2023-08-07 NOTE — Assessment & Plan Note (Addendum)
 Patient admitted to stepdown with withdrawal protocol, thiamine , CIWA, aspiration, fall, seizure precautions. TOC for substance abuse counselor referral. Scheduled librium .

## 2023-08-07 NOTE — Assessment & Plan Note (Addendum)
 2/2 to alcoholic pancreatitis.  PRN morphine .  IV PPI.

## 2023-08-07 NOTE — Assessment & Plan Note (Addendum)
 H/O Hypokalemia , Will replace and follow levels.

## 2023-08-07 NOTE — Assessment & Plan Note (Addendum)
 Pt presenting with intermittent nausea and vomiting more persistent over the past day or 2 and 6 times yesterday.  Secondary to pancreatitis, DKA, possible gastritis esophagitis underlying PUD.  Supportive care with IV fluids IV PPI.  Antiemetics monitoring QT intervals and electrolytes.

## 2023-08-07 NOTE — ED Notes (Signed)
Phlebotomy to obtain labs. 

## 2023-08-07 NOTE — Assessment & Plan Note (Addendum)
 Differentials include alcoholic ketoacidosis, DKA, lactic acidosis. Patient also has elevated beta hydroxy. Started on DKA protocol and insulin  gtt.

## 2023-08-07 NOTE — Assessment & Plan Note (Addendum)
 Alcohol withdrawal seizures, seizure precautions.  No AEDs in chart.

## 2023-08-07 NOTE — Assessment & Plan Note (Addendum)
 Lab Results  Component Value Date   CREATININE 1.26 (H) 08/07/2023   CREATININE 1.46 (H) 08/07/2023   CREATININE 0.55 09/28/2022  Cont with IVF hydration. Avoid contrast.

## 2023-08-07 NOTE — Assessment & Plan Note (Addendum)
 Patient will benefit from antidepressants and antianxiety agents consider starting once LFTs have returned to baseline.

## 2023-08-07 NOTE — ED Notes (Signed)
 [place] at bedside

## 2023-08-07 NOTE — ED Notes (Signed)
 Lab called to add on magnesium

## 2023-08-07 NOTE — Assessment & Plan Note (Addendum)
 Triglycerides level pending.  Alcohol level less than 10 currently. Supportive care with IV fluid IV PPI aggressive hydration.

## 2023-08-07 NOTE — ED Provider Notes (Addendum)
 [place] [facility] AT [facility] Provider Note   CSN: [id number] Arrival date & time: 08/07/23  8295     History  Chief Complaint  Patient presents with   Nausea   Emesis    Angela Palmer is a 38 y.o. female.  HPI     38 year old female with a history of alcohol abuse and dependence with history of withdrawal seizures who presents with concern for abdominal pain, nausea, vomiting.  Reports that she began to have the symptoms a few days ago, with severe epigastric abdominal pain that has worsened.  She has had nausea and vomiting about 5 or 6 times a day for the past 2 days.  Denies any blood in her emesis.  Denies any black or bloody stools.  She has not really had much for bowel movements that she has not been able to eat or drink much over the last few days.  Her last alcoholic beverage was 2 days ago.  She is feeling somewhat shaky.  Denies headache.  She sometimes has tingling in both hands.  Denies urinary symptoms.  Denies other drugs or marijuana use.  Denies fevers, cough, history of abdominal surgeries.  Past Medical History:  Diagnosis Date   Alcohol abuse     History reviewed. No pertinent surgical history.  Home Medications Prior to Admission medications   Medication Sig Start Date End Date Taking? Authorizing Provider  cetirizine  (ZYRTEC ) 10 MG tablet Take 1 tablet (10 mg total) by mouth daily. Patient taking differently: Take 10 mg by mouth daily as needed for allergies or rhinitis. 07/17/21  Yes Wilhemena Harbour, NP      Allergies    Minocycline    Review of Systems   Review of Systems  Physical Exam Updated Vital Signs BP (!) 126/100 (BP Location: Right Arm)   Pulse (!) 113   Temp 97.7 F (36.5 C) (Oral)   Resp (!) 25   Ht 5\' 5"  (1.651 m)   Wt 54.4 kg   SpO2 100%   BMI 19.97 kg/m  Physical Exam Vitals and nursing note reviewed.  Constitutional:      General: She is not in acute distress.    Appearance: She is  well-developed. She is ill-appearing. She is not diaphoretic.     Comments: Initially anxious, tremulous, difficulty sitting still  HENT:     Head: Normocephalic and atraumatic.  Eyes:     Conjunctiva/sclera: Conjunctivae normal.  Cardiovascular:     Rate and Rhythm: Normal rate and regular rhythm.     Heart sounds: Normal heart sounds. No murmur heard.    No friction rub. No gallop.  Pulmonary:     Effort: Pulmonary effort is normal. No respiratory distress.     Breath sounds: Normal breath sounds. No wheezing or rales.     Comments: tachypnea Abdominal:     General: There is no distension.     Palpations: Abdomen is soft.     Tenderness: There is abdominal tenderness (diffuse, worse in epigastrium). There is no guarding.  Musculoskeletal:        General: No tenderness.     Cervical back: Normal range of motion.  Skin:    General: Skin is warm and dry.     Findings: No erythema or rash.  Neurological:     Mental Status: She is alert and oriented to person, place, and time.     ED Results / Procedures / Treatments   Labs (all labs ordered are  listed, but only abnormal results are displayed) Labs Reviewed  CBC WITH DIFFERENTIAL/PLATELET - Abnormal; Notable for the following components:      Result Value   WBC 11.0 (*)    RBC 3.77 (*)    MCV 107.4 (*)    MCH 36.6 (*)    nRBC 0.5 (*)    Neutro Abs 9.6 (*)    Lymphs Abs 0.3 (*)    Monocytes Absolute 1.1 (*)    All other components within normal limits  COMPREHENSIVE METABOLIC PANEL WITH GFR - Abnormal; Notable for the following components:   Sodium 131 (*)    Potassium 3.2 (*)    Chloride 92 (*)    CO2 <7 (*)    Glucose, Bld 326 (*)    Creatinine, Ser 1.46 (*)    Total Protein 8.9 (*)    AST 417 (*)    ALT 133 (*)    Alkaline Phosphatase 151 (*)    Total Bilirubin 4.7 (*)    GFR, Estimated 48 (*)    All other components within normal limits  LIPASE, BLOOD - Abnormal; Notable for the following components:    Lipase 1,710 (*)    All other components within normal limits  URINALYSIS, W/ REFLEX TO CULTURE (INFECTION SUSPECTED) - Abnormal; Notable for the following components:   Specific Gravity, Urine 1.032 (*)    Glucose, UA 50 (*)    Hgb urine dipstick SMALL (*)    Ketones, ur 80 (*)    Protein, ur 100 (*)    Bacteria, UA RARE (*)    All other components within normal limits  BETA-HYDROXYBUTYRIC ACID - Abnormal; Notable for the following components:   Beta-Hydroxybutyric Acid >8.00 (*)    All other components within normal limits  AMMONIA - Abnormal; Notable for the following components:   Ammonia 78 (*)    All other components within normal limits  BASIC METABOLIC PANEL WITH GFR - Abnormal; Notable for the following components:   Sodium 130 (*)    Potassium 3.4 (*)    CO2 <7 (*)    Glucose, Bld 269 (*)    Creatinine, Ser 1.26 (*)    GFR, Estimated 57 (*)    All other components within normal limits  BASIC METABOLIC PANEL WITH GFR - Abnormal; Notable for the following components:   Sodium 131 (*)    CO2 8 (*)    Glucose, Bld 211 (*)    BUN 5 (*)    Calcium 8.7 (*)    Anion gap 17 (*)    All other components within normal limits  BASIC METABOLIC PANEL WITH GFR - Abnormal; Notable for the following components:   Sodium 131 (*)    Potassium 3.1 (*)    CO2 13 (*)    Glucose, Bld 219 (*)    BUN 5 (*)    All other components within normal limits  BETA-HYDROXYBUTYRIC ACID - Abnormal; Notable for the following components:   Beta-Hydroxybutyric Acid 5.56 (*)    All other components within normal limits  BETA-HYDROXYBUTYRIC ACID - Abnormal; Notable for the following components:   Beta-Hydroxybutyric Acid 3.58 (*)    All other components within normal limits  CBC WITH DIFFERENTIAL/PLATELET - Abnormal; Notable for the following components:   RBC 3.82 (*)    MCV 107.1 (*)    MCH 36.1 (*)    nRBC 0.3 (*)    Neutro Abs 8.6 (*)    Lymphs Abs 0.3 (*)    All other components  within  normal limits  HEMOGLOBIN A1C - Abnormal; Notable for the following components:   Hgb A1c MFr Bld 4.0 (*)    All other components within normal limits  TRIGLYCERIDES - Abnormal; Notable for the following components:   Triglycerides 192 (*)    All other components within normal limits  MAGNESIUM  - Abnormal; Notable for the following components:   Magnesium  1.2 (*)    All other components within normal limits  GLUCOSE, CAPILLARY - Abnormal; Notable for the following components:   Glucose-Capillary 225 (*)    All other components within normal limits  GLUCOSE, CAPILLARY - Abnormal; Notable for the following components:   Glucose-Capillary 203 (*)    All other components within normal limits  GLUCOSE, CAPILLARY - Abnormal; Notable for the following components:   Glucose-Capillary 184 (*)    All other components within normal limits  GLUCOSE, CAPILLARY - Abnormal; Notable for the following components:   Glucose-Capillary 190 (*)    All other components within normal limits  I-STAT VENOUS BLOOD GAS, ED - Abnormal; Notable for the following components:   pH, Ven 7.226 (*)    pCO2, Ven 19.7 (*)    Bicarbonate 8.2 (*)    TCO2 9 (*)    Acid-base deficit 17.0 (*)    Sodium 132 (*)    All other components within normal limits  I-STAT CG4 LACTIC ACID, ED - Abnormal; Notable for the following components:   Lactic Acid, Venous 2.2 (*)    All other components within normal limits  CBG MONITORING, ED - Abnormal; Notable for the following components:   Glucose-Capillary 269 (*)    All other components within normal limits  CBG MONITORING, ED - Abnormal; Notable for the following components:   Glucose-Capillary 250 (*)    All other components within normal limits  I-STAT CG4 LACTIC ACID, ED - Abnormal; Notable for the following components:   Lactic Acid, Venous 2.5 (*)    All other components within normal limits  CBG MONITORING, ED - Abnormal; Notable for the following components:    Glucose-Capillary 207 (*)    All other components within normal limits  CBG MONITORING, ED - Abnormal; Notable for the following components:   Glucose-Capillary 203 (*)    All other components within normal limits  CBG MONITORING, ED - Abnormal; Notable for the following components:   Glucose-Capillary 203 (*)    All other components within normal limits  CBG MONITORING, ED - Abnormal; Notable for the following components:   Glucose-Capillary 173 (*)    All other components within normal limits  URINE CULTURE  HCG, SERUM, QUALITATIVE  MAGNESIUM   PROTIME-INR  HCG, SERUM, QUALITATIVE  HIV ANTIBODY (ROUTINE TESTING W REFLEX)  BASIC METABOLIC PANEL WITH GFR  BETA-HYDROXYBUTYRIC ACID  BETA-HYDROXYBUTYRIC ACID  COMPREHENSIVE METABOLIC PANEL WITH GFR  CBC  GLUCOSE, CAPILLARY    EKG EKG Interpretation Date/Time:  Saturday August 07 2023 08:54:28 EDT Ventricular Rate:  120 PR Interval:  119 QRS Duration:  94 QT Interval:  332 QTC Calculation: 470 R Axis:   78  Text Interpretation: Sinus tachycardia Repol abnrm, severe global ischemia (LM/MVD) Confirmed by Lowery Rue [phone]) on 08/07/2023 3:14:37 PM  Radiology US  Abdomen Limited RUQ (LIVER/GB) Result Date: 08/07/2023 CLINICAL DATA:  Pain EXAM: ULTRASOUND ABDOMEN LIMITED RIGHT UPPER QUADRANT COMPARISON:  CT from the same day, and previous FINDINGS: Gallbladder: Physiologically distended. No wall thickening or gallstones. No sonographic Murphy's sign reported by sonographer. Wall thickness 2.6 mm. No pericholecystic fluid. Common bile  duct: Diameter: 2.9 mm.  No intrahepatic biliary ductal dilatation. Liver: No focal lesion identified. Within normal limits in parenchymal echogenicity. Portal vein is patent on color Doppler imaging with normal direction of blood flow towards the liver. Other: None. IMPRESSION: Negative. Electronically Signed   By: Nicoletta Barrier M.D.   On: 08/07/2023 18:52   CT ABDOMEN PELVIS W CONTRAST Result Date:  08/07/2023 CLINICAL DATA:  Abdominal pain, acute, nonlocalized EXAM: CT ABDOMEN AND PELVIS WITH CONTRAST TECHNIQUE: Multidetector CT imaging of the abdomen and pelvis was performed using the standard protocol following bolus administration of intravenous contrast. RADIATION DOSE REDUCTION: This exam was performed according to the departmental dose-optimization program which includes automated exposure control, adjustment of the mA and/or kV according to patient size and/or use of iterative reconstruction technique. CONTRAST:  75mL OMNIPAQUE  IOHEXOL  350 MG/ML SOLN COMPARISON:  Ultrasound 06/26/2019 FINDINGS: Lower chest: Included lung bases are clear.  Heart size is normal. Hepatobiliary: Severe hepatic steatosis. Liver is enlarged measuring approximately 23 cm in length. No focal liver lesion identified. Unremarkable gallbladder. No hyperdense gallstone. No biliary dilatation. Pancreas: Pancreatic tail appears enlarged and edematous. There is peripancreatic fluid adjacent to the pancreatic tail. No organized or walled-off collections. No focal area of parenchymal non enhancement to suggest necrosis. No ductal dilatation. Spleen: Normal in size without focal abnormality. Adrenals/Urinary Tract: Unremarkable adrenal glands. Kidneys enhance symmetrically without focal lesion, stone, or hydronephrosis. Ureters are nondilated. Urinary bladder appears unremarkable for the degree of distention. Stomach/Bowel: Stomach is within normal limits. No evidence of bowel wall thickening, distention, or inflammatory changes. Vascular/Lymphatic: No significant vascular findings are present. No enlarged abdominal or pelvic lymph nodes. Reproductive: Uterus and bilateral adnexa are unremarkable. Other: No pneumoperitoneum.  No abdominal wall hernia. Musculoskeletal: No acute or significant osseous findings. IMPRESSION: 1. Findings compatible with acute uncomplicated pancreatitis. 2. Severe hepatic steatosis and hepatomegaly.  Electronically Signed   By: Leverne Reading D.O.   On: 08/07/2023 15:30    Procedures .Critical Care  Performed by: Scarlette Currier, MD Authorized by: Scarlette Currier, MD   Critical care provider statement:    Critical care time (minutes):  30   Critical care was time spent personally by me on the following activities:  Development of treatment plan with patient or surrogate, evaluation of patient's response to treatment, examination of patient, ordering and review of laboratory studies, ordering and review of radiographic studies, ordering and performing treatments and interventions, pulse oximetry, re-evaluation of patient's condition and review of old charts     Medications Ordered in ED Medications  insulin  regular, human (MYXREDLIN ) 100 units/ 100 mL infusion (1.6 Units/hr Intravenous Rate/Dose Change 08/07/23 2304)  lactated ringers  infusion (0 mLs Intravenous Stopped 08/07/23 1409)  dextrose  5 % in lactated ringers  infusion ( Intravenous New Bag/Given 08/07/23 2211)  dextrose  50 % solution 0-50 mL (has no administration in time range)  thiamine  (VITAMIN B1) 500 mg in sodium chloride  0.9 % 50 mL IVPB (0 mg Intravenous Stopped 08/07/23 1902)  chlordiazePOXIDE  (LIBRIUM ) capsule 10 mg (10 mg Oral Given 08/07/23 2100)  heparin  injection 5,000 Units (5,000 Units Subcutaneous Given 08/07/23 1753)  sodium chloride  flush (NS) 0.9 % injection 3 mL (3 mLs Intravenous Given 08/07/23 2101)  acetaminophen  (TYLENOL ) tablet 650 mg (has no administration in time range)    Or  acetaminophen  (TYLENOL ) suppository 650 mg (has no administration in time range)  HYDROcodone -acetaminophen  (NORCO/VICODIN) 5-325 MG per tablet 1 tablet (1 tablet Oral Given 08/07/23 2100)  morphine  (PF) 2 MG/ML injection 2  mg (has no administration in time range)  sodium chloride  flush (NS) 0.9 % injection 3-10 mL (3 mLs Intravenous Given 08/07/23 2101)  sodium chloride  flush (NS) 0.9 % injection 3-10 mL (has no administration in  time range)  nicotine  (NICODERM CQ  - dosed in mg/24 hours) patch 21 mg (21 mg Transdermal Patch Applied 08/07/23 1753)  thiamine  (VITAMIN B1) tablet 100 mg (has no administration in time range)  lactated ringers  bolus 1,000 mL (0 mLs Intravenous Stopped 08/07/23 1029)  ondansetron  (ZOFRAN ) injection 4 mg (4 mg Intravenous Given 08/07/23 0902)  fentaNYL  (SUBLIMAZE ) injection 50 mcg (50 mcg Intravenous Given 08/07/23 1012)  LORazepam  (ATIVAN ) injection 2 mg (2 mg Intravenous Given 08/07/23 1012)  pantoprazole  (PROTONIX ) injection 80 mg (80 mg Intravenous Given 08/07/23 1013)  iohexol  (OMNIPAQUE ) 350 MG/ML injection 75 mL (75 mLs Intravenous Contrast Given 08/07/23 1223)  lactated ringers  bolus 1,000 mL (0 mLs Intravenous Stopped 08/07/23 1417)  potassium chloride  10 mEq in 100 mL IVPB (10 mEq Intravenous New Bag/Given 08/07/23 1652)  potassium chloride  SA (KLOR-CON  M) CR tablet 40 mEq (40 mEq Oral Given 08/07/23 2206)    ED Course/ Medical Decision Making/ A&P                                  38 year old female with a history of alcohol abuse and dependence with history of withdrawal seizures who presents with concern for abdominal pain, nausea, vomiting.  Differential diagnosis includes pancreatitis, alcohol withdrawal, DKA, gastritis, gastroenteritis, pyelonephritis, perforated peptic ulcer, ectopic pregnancy.  Given ativan  with concern for withdrawal, pain and nausea medications and reports some improvement in symptoms.  Labs completed and personally evaluated and interpreted by me are significant for pancreatitis with a lipase of 1710, elevated transaminases of 417, 133, bilirubin of 4.7, and an anion gap metabolic acidosis with undetectable bicarb and hyperglycemia.  hCG is negative.  She is hypokalemic with a normal magnesium   She has no history of diabetes, but in setting of hyperglycemia we will treat for possible DKA in the setting of anion gap metabolic acidosis and hyperglycemia.  Other  possibilities include lactic acidosis or alcoholic ketosis with hyperglycemia.  Insulin  gtt and K ordered. Also ordered hbA1c.    VBG shows pH 7.226, pCO2 19. .  INR 1.1 and ammonia 78.  CT abdomen pelvis ordered and pending at time of transfer of care.           Final Clinical Impression(s) / ED Diagnoses Final diagnoses:  Alcohol-induced acute pancreatitis, unspecified complication status  Metabolic acidosis  Ketoacidosis  Hyperglycemia    Rx / DC Orders ED Discharge Orders     None         Scarlette Currier, MD 08/07/23 1610    Scarlette Currier, MD 08/07/23 2306

## 2023-08-07 NOTE — ED Provider Notes (Signed)
 Patient handed off to me at 3 PM.  Looks like she has acute pancreatitis with may be new DKA but it could also be a starvation ketosis.  pH is 7.22, lactic is 2.5 bicarb is 8.2.  Ketones are positive.  Blood sugar 269.  She is a heavy alcohol drinker.  CT scan confirms acute pancreatitis.  She is awake and alert and overall appears well I think she stable for stepdown.  Not exhibiting any major signs of withdrawal at this time.  This chart was dictated using voice recognition software.  Despite best efforts to proofread,  errors can occur which can change the documentation meaning.    Lowery Rue, DO 08/07/23 1536

## 2023-08-07 NOTE — ED Notes (Signed)
 Patient transported to CT

## 2023-08-07 NOTE — Assessment & Plan Note (Addendum)
 H/O hypomag and 2nd to ETOH , Will replace and follow levels.

## 2023-08-08 DIAGNOSIS — R1112 Projectile vomiting: Secondary | ICD-10-CM | POA: Diagnosis not present

## 2023-08-08 LAB — BETA-HYDROXYBUTYRIC ACID
Beta-Hydroxybutyric Acid: 1.42 mmol/L — ABNORMAL HIGH (ref 0.05–0.27)
Beta-Hydroxybutyric Acid: 0.38 mmol/L — ABNORMAL HIGH (ref 0.05–0.27)

## 2023-08-08 LAB — T4, FREE: Free T4: 1.1 ng/dL (ref 0.61–1.12)

## 2023-08-08 LAB — GLUCOSE, CAPILLARY
Glucose-Capillary: 200 mg/dL — ABNORMAL HIGH (ref 70–99)
Glucose-Capillary: 149 mg/dL — ABNORMAL HIGH (ref 70–99)
Glucose-Capillary: 165 mg/dL — ABNORMAL HIGH (ref 70–99)
Glucose-Capillary: 166 mg/dL — ABNORMAL HIGH (ref 70–99)
Glucose-Capillary: 177 mg/dL — ABNORMAL HIGH (ref 70–99)
Glucose-Capillary: 99 mg/dL (ref 70–99)
Glucose-Capillary: 114 mg/dL — ABNORMAL HIGH (ref 70–99)
Glucose-Capillary: 124 mg/dL — ABNORMAL HIGH (ref 70–99)
Glucose-Capillary: 139 mg/dL — ABNORMAL HIGH (ref 70–99)
Glucose-Capillary: 104 mg/dL — ABNORMAL HIGH (ref 70–99)
Glucose-Capillary: 87 mg/dL (ref 70–99)

## 2023-08-08 LAB — BASIC METABOLIC PANEL WITH GFR
Anion gap: 12 (ref 5–15)
BUN: <5 mg/dL — ABNORMAL LOW (ref 6–20)
CO2: 15 mmol/L — ABNORMAL LOW (ref 22–32)
Calcium: 9.4 mg/dL (ref 8.9–10.3)
Chloride: 105 mmol/L (ref 98–111)
Creatinine, Ser: 0.68 mg/dL (ref 0.44–1.00)
GFR, Estimated: >60 mL/min (ref >60)
Glucose, Bld: 191 mg/dL — ABNORMAL HIGH (ref 70–99)
Potassium: 3 mmol/L — ABNORMAL LOW (ref 3.5–5.1)
Sodium: 132 mmol/L — ABNORMAL LOW (ref 135–145)

## 2023-08-08 LAB — CBC
HCT: 35.8 % — ABNORMAL LOW (ref 36.0–46.0)
Hemoglobin: 13 g/dL (ref 12.0–15.0)
MCH: 36.3 pg — ABNORMAL HIGH (ref 26.0–34.0)
MCHC: 36.3 g/dL — ABNORMAL HIGH (ref 30.0–36.0)
MCV: 100 fL (ref 80.0–100.0)
Platelets: 142 10*3/uL — ABNORMAL LOW (ref 150–400)
RBC: 3.58 MIL/uL — ABNORMAL LOW (ref 3.87–5.11)
RDW: 12.6 % (ref 11.5–15.5)
WBC: 7.9 10*3/uL (ref 4.0–10.5)
nRBC: 0 % (ref 0.0–0.2)

## 2023-08-08 LAB — COMPREHENSIVE METABOLIC PANEL WITH GFR
ALT: 93 U/L — ABNORMAL HIGH (ref 0–44)
AST: 313 U/L — ABNORMAL HIGH (ref 15–41)
Albumin: 3.5 g/dL (ref 3.5–5.0)
Alkaline Phosphatase: 105 U/L (ref 38–126)
Anion gap: 10 (ref 5–15)
BUN: 7 mg/dL (ref 6–20)
CO2: 16 mmol/L — ABNORMAL LOW (ref 22–32)
Calcium: 9.9 mg/dL (ref 8.9–10.3)
Chloride: 108 mmol/L (ref 98–111)
Creatinine, Ser: 0.57 mg/dL (ref 0.44–1.00)
GFR, Estimated: >60 mL/min (ref >60)
Glucose, Bld: 137 mg/dL — ABNORMAL HIGH (ref 70–99)
Potassium: 3.2 mmol/L — ABNORMAL LOW (ref 3.5–5.1)
Sodium: 134 mmol/L — ABNORMAL LOW (ref 135–145)
Total Bilirubin: 3.2 mg/dL — ABNORMAL HIGH (ref 0.0–1.2)
Total Protein: 6.7 g/dL (ref 6.5–8.1)

## 2023-08-08 LAB — TSH: TSH: 1.339 u[IU]/mL (ref 0.350–4.500)

## 2023-08-08 LAB — MAGNESIUM: Magnesium: 1.3 mg/dL — ABNORMAL LOW (ref 1.7–2.4)

## 2023-08-08 MED ORDER — SODIUM CHLORIDE 0.9 % IV SOLN
1.0000 g | INTRAVENOUS | Status: AC
  Administered 2023-08-08 – 2023-08-10 (×3): 1 g via INTRAVENOUS
  Filled 2023-08-08 (×3): qty 10

## 2023-08-08 MED ORDER — THIAMINE HCL 100 MG/ML IJ SOLN
500.0000 mg | Freq: Every day | INTRAVENOUS | Status: AC
  Administered 2023-08-09 – 2023-08-10 (×2): 500 mg via INTRAVENOUS
  Filled 2023-08-08 (×2): qty 5

## 2023-08-08 MED ORDER — POTASSIUM CHLORIDE CRYS ER 20 MEQ PO TBCR
40.0000 meq | EXTENDED_RELEASE_TABLET | Freq: Once | ORAL | Status: AC
  Administered 2023-08-08: 40 meq via ORAL
  Filled 2023-08-08: qty 2

## 2023-08-08 MED ORDER — CHLORDIAZEPOXIDE HCL 5 MG PO CAPS
10.0000 mg | ORAL_CAPSULE | Freq: Three times a day (TID) | ORAL | Status: AC
  Administered 2023-08-08 – 2023-08-11 (×12): 10 mg via ORAL
  Filled 2023-08-08 (×12): qty 2

## 2023-08-08 MED ORDER — ADULT MULTIVITAMIN W/MINERALS CH
1.0000 | ORAL_TABLET | Freq: Every day | ORAL | Status: DC
  Administered 2023-08-08 – 2023-08-10 (×3): 1 via ORAL
  Filled 2023-08-08 (×4): qty 1

## 2023-08-08 MED ORDER — LACTATED RINGERS IV SOLN: INTRAVENOUS | Status: AC

## 2023-08-08 MED ORDER — THIAMINE MONONITRATE 100 MG PO TABS
100.0000 mg | ORAL_TABLET | Freq: Every day | ORAL | Status: DC
  Administered 2023-08-11: 100 mg via ORAL
  Filled 2023-08-08: qty 1

## 2023-08-08 MED ORDER — LORAZEPAM 2 MG/ML IJ SOLN: 1.0000 mg | INTRAMUSCULAR | Status: AC | PRN

## 2023-08-08 MED ORDER — FOLIC ACID 1 MG PO TABS
1.0000 mg | ORAL_TABLET | Freq: Every day | ORAL | Status: DC
  Administered 2023-08-08 – 2023-08-11 (×4): 1 mg via ORAL
  Filled 2023-08-08 (×4): qty 1

## 2023-08-08 MED ORDER — METOPROLOL TARTRATE 25 MG PO TABS
25.0000 mg | ORAL_TABLET | Freq: Two times a day (BID) | ORAL | Status: DC
  Administered 2023-08-08 – 2023-08-11 (×7): 25 mg via ORAL
  Filled 2023-08-08 (×8): qty 1

## 2023-08-08 MED ORDER — MAGNESIUM SULFATE 4 GM/100ML IV SOLN
4.0000 g | Freq: Once | INTRAVENOUS | Status: AC
  Administered 2023-08-08: 4 g via INTRAVENOUS
  Filled 2023-08-08: qty 100

## 2023-08-08 MED ORDER — LORAZEPAM 1 MG PO TABS: 1.0000 mg | ORAL_TABLET | ORAL | Status: AC | PRN

## 2023-08-08 MED ORDER — INSULIN ASPART 100 UNIT/ML IJ SOLN
0.0000 [IU] | Freq: Three times a day (TID) | INTRAMUSCULAR | Status: DC
  Administered 2023-08-08: 2 [IU] via SUBCUTANEOUS

## 2023-08-08 MED ORDER — INSULIN GLARGINE-YFGN 100 UNIT/ML ~~LOC~~ SOLN
7.0000 [IU] | Freq: Every day | SUBCUTANEOUS | Status: DC
  Administered 2023-08-08: 7 [IU] via SUBCUTANEOUS
  Filled 2023-08-08: qty 0.07

## 2023-08-08 MED ORDER — INSULIN ASPART 100 UNIT/ML IJ SOLN: 0.0000 [IU] | Freq: Every day | INTRAMUSCULAR | Status: DC

## 2023-08-08 NOTE — Progress Notes (Signed)
 PROGRESS NOTE                                                                                                                                                                                                             Patient Demographics:    Angela Palmer, is a 38 y.o. female, DOB - [birth year], JJO:[id number]  Outpatient Primary MD for the patient is Patient, No Pcp Per    LOS - 1  Admit date - 08/07/2023    Chief Complaint  Patient presents with   Nausea   Emesis       Brief Narrative (HPI from H&P)    38 y.o. female with past medical history  of alcohol abuse and tobacco abuse, allergy to minocycline, history of seizures suspect secondary to alcohol withdrawal, patient's last drink was a few days ago.pt drinks whiskey few days a week and come with nausea , vomiting and abdominal pain.  Workup was consistent with acute alcoholic pancreatitis and she was admitted to the hospital   Subjective:    Angela Palmer today has, No headache, No chest pain, improved abdominal pain - No Nausea, No new weakness tingling or numbness, no SOB   Assessment  & Plan :    Acute alcoholic pancreatitis, with alcoholic ketoacidosis, dehydration, nausea vomiting and abdominal pain. She is being treated conservatively with bowel rest, IV fluids, strictly counseled to abstain from alcohol, CT abdomen pelvis and right upper quadrant ultrasound do not show any gallstones or CBD dilation, triglycerides unremarkable, treat conservatively and monitor.  AKI.  Due to dehydration.  Improving with IV fluids.  Moderate episode of recurrent major depressive disorder (HCC) -  not Seidel homicidal, no acute issues outpatient follow-up with psych to be arranged by PCP.   Alcohol hepatitis - discriminant function score is 9, monitor   Alcoholic ketoacidosis.  Supportive care.  Also causing high beta hydroxybutyrate, monitor.  Hypomagnesemia & hypokalemia -  Replaced.   Seizure (HCC) - Alcohol withdrawal seizures, seizure precautions.  No AEDs in chart.   UTI.  Rocephin  3 days.    Stress-induced hyperglycemia.  SSI if needed.  Lab Results  Component Value Date   HGBA1C 4.0 (L) 08/07/2023   CBG (last 3)  Recent Labs    08/08/23 0658 08/08/23 0742 08/08/23 0846  GLUCAP 99 114* 124*  Condition - Fair  Family Communication  :   None present  Code Status :   Full  Consults  :   PUD Prophylaxis :  PPI   Procedures  :     Right upper quadrant ultrasound.  Nonacute.    CT abdomen pelvis. 1. Findings compatible with acute uncomplicated pancreatitis. 2. Severe hepatic steatosis and hepatomegaly.       Disposition Plan  :    Status is: Inpatient  DVT Prophylaxis  :    heparin  injection 5,000 Units Start: 08/07/23 1745    Lab Results  Component Value Date   PLT 142 (L) 08/08/2023    Diet :  Diet Order             Diet heart healthy/carb modified Room service appropriate? Yes; Fluid consistency: Thin  Diet effective now                    Inpatient Medications  Scheduled Meds:  chlordiazePOXIDE   10 mg Oral TID   folic acid   1 mg Oral Daily   heparin   5,000 Units Subcutaneous Q12H   metoprolol  tartrate  25 mg Oral BID   multivitamin with minerals  1 tablet Oral Daily   nicotine   21 mg Transdermal Daily   sodium chloride  flush  3 mL Intravenous Q12H   sodium chloride  flush  3-10 mL Intravenous Q12H   [START ON 08/11/2023] thiamine   100 mg Oral Daily   Continuous Infusions:  cefTRIAXone  (ROCEPHIN )  IV 1 g (08/08/23 0657)   lactated ringers      [START ON 08/09/2023] thiamine  (VITAMIN B1) injection     PRN Meds:.acetaminophen  **OR** acetaminophen , dextrose , HYDROcodone -acetaminophen , LORazepam  **OR** LORazepam , morphine  injection  Antibiotics  :    Anti-infectives (From admission, onward)    Start     Dose/Rate Route Frequency Ordered Stop   08/08/23 0645  cefTRIAXone  (ROCEPHIN ) 1 g in sodium  chloride 0.9 % 100 mL IVPB        1 g 200 mL/hr over 30 Minutes Intravenous Every 24 hours 08/08/23 0559 08/11/23 0644         Objective:   Vitals:   08/07/23 2343 08/08/23 0250 08/08/23 0500 08/08/23 0855  BP: 120/83 104/82  120/82  Pulse: (!) 121 (!) 116  (!) 114  Resp: 19 19  20   Temp: 98.5 F (36.9 C) 97.9 F (36.6 C)  (!) 97.5 F (36.4 C)  TempSrc: Oral Oral  Oral  SpO2: 97% 98%  99%  Weight:   49.3 kg   Height:        Wt Readings from Last 3 Encounters:  08/08/23 49.3 kg  09/28/22 56.7 kg  08/03/19 55.3 kg     Intake/Output Summary (Last 24 hours) at 08/08/2023 1017 Last data filed at 08/07/2023 2101 Gross per 24 hour  Intake 11.75 ml  Output --  Net 11.75 ml     Physical Exam  Awake Alert, No new F.N deficits, Normal affect [place].AT,PERRAL Supple Neck, No JVD,   Symmetrical Chest wall movement, Good air movement bilaterally, CTAB RRR,No Gallops,Rubs or new Murmurs,  +ve B.Sounds, Abd Soft, mild epigastric tenderness,   No Cyanosis, Clubbing or edema        Data Review:    Recent Labs  Lab 08/07/23 0852 08/07/23 1326 08/07/23 1515 08/08/23 0640  WBC 11.0* 9.5  --  7.9  HGB 13.8 13.8 13.9 13.0  HCT 40.5 40.9 41.0 35.8*  PLT 197 170  --  142*  MCV 107.4* 107.1*  --  100.0  MCH 36.6* 36.1*  --  36.3*  MCHC 34.1 33.7  --  36.3*  RDW 12.9 13.0  --  12.6  LYMPHSABS 0.3* 0.3*  --   --   MONOABS 1.1* 0.6  --   --   EOSABS 0.0 0.0  --   --   BASOSABS 0.0 0.0  --   --     Recent Labs  Lab 08/07/23 0852 08/07/23 1326 08/07/23 1328 08/07/23 1515 08/07/23 1715 08/07/23 2034 08/08/23 0113 08/08/23 0640  NA 131* 130*  --  132* 131* 131* 132* 134*  K 3.2* 3.4*  --  3.6 5.1 3.1* 3.0* 3.2*  CL 92* 98  --   --  106 105 105 108  CO2 <7* <7*  --   --  8* 13* 15* 16*  ANIONGAP NOT CALCULATED NOT CALCULATED  --   --  17* 13 12 10   GLUCOSE 326* 269*  --   --  211* 219* 191* 137*  BUN 6 6  --   --  5* 5* <5* 7  CREATININE 1.46* 1.26*  --   --  0.90  0.84 0.68 0.57  AST 417*  --   --   --   --   --   --  313*  ALT 133*  --   --   --   --   --   --  93*  ALKPHOS 151*  --   --   --   --   --   --  105  BILITOT 4.7*  --   --   --   --   --   --  3.2*  ALBUMIN 4.6  --   --   --   --   --   --  3.5  LATICACIDVEN  --   --  2.2* 2.5*  --   --   --   --   INR  --  1.1  --   --   --   --   --   --   HGBA1C  --  4.0*  --   --   --   --   --   --   AMMONIA  --  78*  --   --   --   --   --   --   MG 1.8  --   --   --   --  1.2*  --  1.3*  CALCIUM 9.9 9.0  --   --  8.7* 9.4 9.4 9.9      Recent Labs  Lab 08/07/23 0852 08/07/23 1326 08/07/23 1328 08/07/23 1515 08/07/23 1715 08/07/23 2034 08/08/23 0113 08/08/23 0640  LATICACIDVEN  --   --  2.2* 2.5*  --   --   --   --   INR  --  1.1  --   --   --   --   --   --   HGBA1C  --  4.0*  --   --   --   --   --   --   AMMONIA  --  78*  --   --   --   --   --   --   MG 1.8  --   --   --   --  1.2*  --  1.3*  CALCIUM 9.9 9.0  --   --  8.7* 9.4 9.4 9.9    ---------------------------------------------------------------------------------------------------------------  Lab Results  Component Value Date   TRIG 192 (H) 08/07/2023    Lab Results  Component Value Date   HGBA1C 4.0 (L) 08/07/2023   No results for input(s): "TSH", "T4TOTAL", "FREET4", "T3FREE", "THYROIDAB" in the last 72 hours. No results for input(s): "VITAMINB12", "FOLATE", "FERRITIN", "TIBC", "IRON", "RETICCTPCT" in the last 72 hours.    Radiology Report US  Abdomen Limited RUQ (LIVER/GB) Result Date: 08/07/2023 CLINICAL DATA:  Pain EXAM: ULTRASOUND ABDOMEN LIMITED RIGHT UPPER QUADRANT COMPARISON:  CT from the same day, and previous FINDINGS: Gallbladder: Physiologically distended. No wall thickening or gallstones. No sonographic Murphy's sign reported by sonographer. Wall thickness 2.6 mm. No pericholecystic fluid. Common bile duct: Diameter: 2.9 mm.  No intrahepatic biliary ductal dilatation. Liver: No focal lesion identified.  Within normal limits in parenchymal echogenicity. Portal vein is patent on color Doppler imaging with normal direction of blood flow towards the liver. Other: None. IMPRESSION: Negative. Electronically Signed   By: Nicoletta Barrier M.D.   On: 08/07/2023 18:52   CT ABDOMEN PELVIS W CONTRAST Result Date: 08/07/2023 CLINICAL DATA:  Abdominal pain, acute, nonlocalized EXAM: CT ABDOMEN AND PELVIS WITH CONTRAST TECHNIQUE: Multidetector CT imaging of the abdomen and pelvis was performed using the standard protocol following bolus administration of intravenous contrast. RADIATION DOSE REDUCTION: This exam was performed according to the departmental dose-optimization program which includes automated exposure control, adjustment of the mA and/or kV according to patient size and/or use of iterative reconstruction technique. CONTRAST:  75mL OMNIPAQUE  IOHEXOL  350 MG/ML SOLN COMPARISON:  Ultrasound 06/26/2019 FINDINGS: Lower chest: Included lung bases are clear.  Heart size is normal. Hepatobiliary: Severe hepatic steatosis. Liver is enlarged measuring approximately 23 cm in length. No focal liver lesion identified. Unremarkable gallbladder. No hyperdense gallstone. No biliary dilatation. Pancreas: Pancreatic tail appears enlarged and edematous. There is peripancreatic fluid adjacent to the pancreatic tail. No organized or walled-off collections. No focal area of parenchymal non enhancement to suggest necrosis. No ductal dilatation. Spleen: Normal in size without focal abnormality. Adrenals/Urinary Tract: Unremarkable adrenal glands. Kidneys enhance symmetrically without focal lesion, stone, or hydronephrosis. Ureters are nondilated. Urinary bladder appears unremarkable for the degree of distention. Stomach/Bowel: Stomach is within normal limits. No evidence of bowel wall thickening, distention, or inflammatory changes. Vascular/Lymphatic: No significant vascular findings are present. No enlarged abdominal or pelvic lymph nodes.  Reproductive: Uterus and bilateral adnexa are unremarkable. Other: No pneumoperitoneum.  No abdominal wall hernia. Musculoskeletal: No acute or significant osseous findings. IMPRESSION: 1. Findings compatible with acute uncomplicated pancreatitis. 2. Severe hepatic steatosis and hepatomegaly. Electronically Signed   By: Leverne Reading D.O.   On: 08/07/2023 15:30     Signature  -   Lynnwood Sauer M.D on 08/08/2023 at 10:17 AM   -  To page go to [email or web address]

## 2023-08-08 NOTE — Plan of Care (Signed)
  Problem: Clinical Measurements: Goal: Ability to maintain clinical measurements within normal limits will improve Outcome: Progressing Goal: Will remain free from infection Outcome: Progressing   Problem: Activity: Goal: Risk for activity intolerance will decrease Outcome: Progressing   Problem: Coping: Goal: Level of anxiety will decrease Outcome: Progressing   Problem: Pain Managment: Goal: General experience of comfort will improve and/or be controlled Outcome: Progressing

## 2023-08-09 DIAGNOSIS — R1112 Projectile vomiting: Secondary | ICD-10-CM | POA: Diagnosis not present

## 2023-08-09 LAB — GLUCOSE, CAPILLARY
Glucose-Capillary: 97 mg/dL (ref 70–99)
Glucose-Capillary: 100 mg/dL — ABNORMAL HIGH (ref 70–99)
Glucose-Capillary: 99 mg/dL (ref 70–99)
Glucose-Capillary: 115 mg/dL — ABNORMAL HIGH (ref 70–99)

## 2023-08-09 LAB — COMPREHENSIVE METABOLIC PANEL WITH GFR
ALT: 90 U/L — ABNORMAL HIGH (ref 0–44)
AST: 326 U/L — ABNORMAL HIGH (ref 15–41)
Albumin: 2.6 g/dL — ABNORMAL LOW (ref 3.5–5.0)
Alkaline Phosphatase: 90 U/L (ref 38–126)
Anion gap: 10 (ref 5–15)
BUN: <5 mg/dL — ABNORMAL LOW (ref 6–20)
CO2: 22 mmol/L (ref 22–32)
Calcium: 8.3 mg/dL — ABNORMAL LOW (ref 8.9–10.3)
Chloride: 103 mmol/L (ref 98–111)
Creatinine, Ser: 0.42 mg/dL — ABNORMAL LOW (ref 0.44–1.00)
GFR, Estimated: >60 mL/min (ref >60)
Glucose, Bld: 90 mg/dL (ref 70–99)
Potassium: 2.7 mmol/L — CL (ref 3.5–5.1)
Sodium: 135 mmol/L (ref 135–145)
Total Bilirubin: 2.6 mg/dL — ABNORMAL HIGH (ref 0.0–1.2)
Total Protein: 5.2 g/dL — ABNORMAL LOW (ref 6.5–8.1)

## 2023-08-09 LAB — CBC WITH DIFFERENTIAL/PLATELET
Abs Immature Granulocytes: 0.02 10*3/uL (ref 0.00–0.07)
Basophils Absolute: 0 10*3/uL (ref 0.0–0.1)
Basophils Relative: 0 %
Eosinophils Absolute: 0 10*3/uL (ref 0.0–0.5)
Eosinophils Relative: 1 %
HCT: 26.8 % — ABNORMAL LOW (ref 36.0–46.0)
Hemoglobin: 9.6 g/dL — ABNORMAL LOW (ref 12.0–15.0)
Immature Granulocytes: 0 %
Lymphocytes Relative: 19 %
Lymphs Abs: 1 10*3/uL (ref 0.7–4.0)
MCH: 36.6 pg — ABNORMAL HIGH (ref 26.0–34.0)
MCHC: 35.8 g/dL (ref 30.0–36.0)
MCV: 102.3 fL — ABNORMAL HIGH (ref 80.0–100.0)
Monocytes Absolute: 0.6 10*3/uL (ref 0.1–1.0)
Monocytes Relative: 12 %
Neutro Abs: 3.4 10*3/uL (ref 1.7–7.7)
Neutrophils Relative %: 68 %
Platelets: 124 10*3/uL — ABNORMAL LOW (ref 150–400)
RBC: 2.62 MIL/uL — ABNORMAL LOW (ref 3.87–5.11)
RDW: 13 % (ref 11.5–15.5)
WBC: 5 10*3/uL (ref 4.0–10.5)
nRBC: 0 % (ref 0.0–0.2)

## 2023-08-09 LAB — MAGNESIUM: Magnesium: 1.5 mg/dL — ABNORMAL LOW (ref 1.7–2.4)

## 2023-08-09 LAB — PHOSPHORUS: Phosphorus: <1 mg/dL — CL (ref 2.5–4.6)

## 2023-08-09 MED ORDER — MAGNESIUM HYDROXIDE 400 MG/5ML PO SUSP
30.0000 mL | Freq: Two times a day (BID) | ORAL | Status: AC
  Administered 2023-08-09: 30 mL via ORAL
  Filled 2023-08-09 (×2): qty 30

## 2023-08-09 MED ORDER — POTASSIUM CHLORIDE CRYS ER 20 MEQ PO TBCR: 40.0000 meq | EXTENDED_RELEASE_TABLET | Freq: Once | ORAL | Status: DC

## 2023-08-09 MED ORDER — POTASSIUM CHLORIDE CRYS ER 20 MEQ PO TBCR
40.0000 meq | EXTENDED_RELEASE_TABLET | Freq: Once | ORAL | Status: AC
  Administered 2023-08-09: 40 meq via ORAL
  Filled 2023-08-09: qty 2

## 2023-08-09 MED ORDER — LACTATED RINGERS IV SOLN: INTRAVENOUS | Status: DC

## 2023-08-09 MED ORDER — POTASSIUM CHLORIDE 10 MEQ/100ML IV SOLN
10.0000 meq | INTRAVENOUS | Status: AC
  Administered 2023-08-09 (×4): 10 meq via INTRAVENOUS
  Filled 2023-08-09 (×4): qty 100

## 2023-08-09 MED ORDER — DOCUSATE SODIUM 100 MG PO CAPS
200.0000 mg | ORAL_CAPSULE | Freq: Two times a day (BID) | ORAL | Status: DC
  Administered 2023-08-09 – 2023-08-11 (×4): 200 mg via ORAL
  Filled 2023-08-09 (×5): qty 2

## 2023-08-09 MED ORDER — POLYETHYLENE GLYCOL 3350 17 G PO PACK
17.0000 g | PACK | Freq: Two times a day (BID) | ORAL | Status: DC
  Administered 2023-08-09 – 2023-08-10 (×2): 17 g via ORAL
  Filled 2023-08-09 (×4): qty 1

## 2023-08-09 MED ORDER — K PHOS MONO-SOD PHOS DI & MONO 155-852-130 MG PO TABS
500.0000 mg | ORAL_TABLET | Freq: Once | ORAL | Status: AC
  Administered 2023-08-09: 500 mg via ORAL
  Filled 2023-08-09: qty 2

## 2023-08-09 MED ORDER — SODIUM PHOSPHATES 45 MMOLE/15ML IV SOLN
30.0000 mmol | Freq: Once | INTRAVENOUS | Status: AC
  Administered 2023-08-09: 30 mmol via INTRAVENOUS
  Filled 2023-08-09: qty 10

## 2023-08-09 MED ORDER — POTASSIUM CHLORIDE CRYS ER 20 MEQ PO TBCR: 20.0000 meq | EXTENDED_RELEASE_TABLET | Freq: Once | ORAL | Status: DC

## 2023-08-09 MED ORDER — MAGNESIUM SULFATE 4 GM/100ML IV SOLN
4.0000 g | Freq: Once | INTRAVENOUS | Status: AC
  Administered 2023-08-09: 4 g via INTRAVENOUS
  Filled 2023-08-09: qty 100

## 2023-08-09 NOTE — Plan of Care (Signed)
  Problem: Education: Goal: Knowledge of General Education information will improve Description: Including pain rating scale, medication(s)/side effects and non-pharmacologic comfort measures Outcome: Progressing   Problem: Clinical Measurements: Goal: Ability to maintain clinical measurements within normal limits will improve Outcome: Progressing   Problem: Activity: Goal: Risk for activity intolerance will decrease Outcome: Progressing   Problem: Nutrition: Goal: Adequate nutrition will be maintained Outcome: Progressing   Problem: Safety: Goal: Ability to remain free from injury will improve Outcome: Progressing   

## 2023-08-09 NOTE — Progress Notes (Signed)
 [facility] night cross cover note:   I was notified by RN of potassium level 2.7 this morning as well as phosphorus level 1.0.  I subsequently ordered potassium chloride  40 mEq p.o. x 1 dose now as well as K-Phos Neutral 500 mg p.o. x 1 dose now.     Camelia Cavalier, DO Hospitalist

## 2023-08-09 NOTE — Progress Notes (Signed)
 PROGRESS NOTE                                                                                                                                                                                                             Patient Demographics:    Angela Palmer, is a 38 y.o. female, DOB - [birth year], ZOX:[id number]  Outpatient Primary MD for the patient is Patient, No Pcp Per    LOS - 2  Admit date - 08/07/2023    Chief Complaint  Patient presents with   Nausea   Emesis       Brief Narrative (HPI from H&P)    38 y.o. female with past medical history  of alcohol abuse and tobacco abuse, allergy to minocycline, history of seizures suspect secondary to alcohol withdrawal, patient's last drink was a few days ago.pt drinks whiskey few days a week and come with nausea , vomiting and abdominal pain.  Workup was consistent with acute alcoholic pancreatitis and she was admitted to the hospital   Subjective:   Patient in bed, appears comfortable, denies any headache, no fever, no chest pain or pressure, no shortness of breath , improved abdominal pain. No focal weakness.   Assessment  & Plan :    Acute alcoholic pancreatitis, with alcoholic ketoacidosis, dehydration, nausea vomiting and abdominal pain. She is being treated conservatively with bowel rest, IV fluids, strictly counseled to abstain from alcohol, CT abdomen pelvis and right upper quadrant ultrasound do not show any gallstones or CBD dilation, triglycerides unremarkable, treat conservatively and monitor.  AKI.  Due to dehydration.  Improving with IV fluids.  Moderate episode of recurrent major depressive disorder (HCC) -  not Seidel homicidal, no acute issues outpatient follow-up with psych to be arranged by PCP.   Alcohol hepatitis - discriminant function score is 9, monitor   Alcoholic ketoacidosis.  Supportive care.  Also causing high beta hydroxybutyrate,  monitor.  Hypomagnesemia hypophosphatemia & hypokalemia - Replaced.   Seizure (HCC) - Alcohol withdrawal seizures, seizure precautions.  No AEDs in chart.   UTI.  Rocephin  3 days.    Stress-induced hyperglycemia.  SSI if needed.  Lab Results  Component Value Date   HGBA1C 4.0 (L) 08/07/2023   CBG (last 3)  Recent Labs    08/08/23 1613 08/08/23 2212 08/09/23 0747  GLUCAP 104* 87 97  Condition - Fair  Family Communication  :   None present  Code Status :   Full  Consults  :   PUD Prophylaxis :  PPI   Procedures  :     Right upper quadrant ultrasound.  Nonacute.    CT abdomen pelvis. 1. Findings compatible with acute uncomplicated pancreatitis. 2. Severe hepatic steatosis and hepatomegaly.       Disposition Plan  :    Status is: Inpatient  DVT Prophylaxis  :    heparin  injection 5,000 Units Start: 08/07/23 1745    Lab Results  Component Value Date   PLT 124 (L) 08/09/2023    Diet :  Diet Order             Diet heart healthy/carb modified Room service appropriate? Yes; Fluid consistency: Thin  Diet effective now                    Inpatient Medications  Scheduled Meds:  chlordiazePOXIDE   10 mg Oral TID   folic acid   1 mg Oral Daily   heparin   5,000 Units Subcutaneous Q12H   metoprolol  tartrate  25 mg Oral BID   multivitamin with minerals  1 tablet Oral Daily   nicotine   21 mg Transdermal Daily   potassium chloride   40 mEq Oral Once   sodium chloride  flush  3 mL Intravenous Q12H   sodium chloride  flush  3-10 mL Intravenous Q12H   [START ON 08/11/2023] thiamine   100 mg Oral Daily   Continuous Infusions:  cefTRIAXone  (ROCEPHIN )  IV Stopped (08/09/23 0629)   lactated ringers      potassium chloride  10 mEq (08/09/23 0933)   sodium PHOSPHATE IVPB (in mmol) 30 mmol (08/09/23 0946)   thiamine  (VITAMIN B1) injection 500 mg (08/09/23 0936)   PRN Meds:.acetaminophen  **OR** acetaminophen , dextrose , HYDROcodone -acetaminophen , LORazepam   **OR** LORazepam , morphine  injection  Antibiotics  :    Anti-infectives (From admission, onward)    Start     Dose/Rate Route Frequency Ordered Stop   08/08/23 0645  cefTRIAXone  (ROCEPHIN ) 1 g in sodium chloride  0.9 % 100 mL IVPB        1 g 200 mL/hr over 30 Minutes Intravenous Every 24 hours 08/08/23 0559 08/11/23 0644         Objective:   Vitals:   08/08/23 2347 08/09/23 0400 08/09/23 0419 08/09/23 0456  BP: 113/88 112/84  104/72  Pulse: 99 93  97  Resp: 18 17  16   Temp: 99.3 F (37.4 C) 98.3 F (36.8 C)  98.9 F (37.2 C)  TempSrc: Oral Oral  Oral  SpO2: 100% 99%  100%  Weight:   49.5 kg   Height:        Wt Readings from Last 3 Encounters:  08/09/23 49.5 kg  09/28/22 56.7 kg  08/03/19 55.3 kg     Intake/Output Summary (Last 24 hours) at 08/09/2023 1025 Last data filed at 08/08/2023 2105 Gross per 24 hour  Intake 3 ml  Output --  Net 3 ml     Physical Exam  Awake Alert, No new F.N deficits, Normal affect [place].AT,PERRAL Supple Neck, No JVD,   Symmetrical Chest wall movement, Good air movement bilaterally, CTAB RRR,No Gallops,Rubs or new Murmurs,  +ve B.Sounds, Abd Soft, mild epigastric tenderness,   No Cyanosis, Clubbing or edema        Data Review:    Recent Labs  Lab 08/07/23 0852 08/07/23 1326 08/07/23 1515 08/08/23 0640 08/09/23 0453  WBC 11.0* 9.5  --  7.9 5.0  HGB 13.8 13.8 13.9 13.0 9.6*  HCT 40.5 40.9 41.0 35.8* 26.8*  PLT 197 170  --  142* 124*  MCV 107.4* 107.1*  --  100.0 102.3*  MCH 36.6* 36.1*  --  36.3* 36.6*  MCHC 34.1 33.7  --  36.3* 35.8  RDW 12.9 13.0  --  12.6 13.0  LYMPHSABS 0.3* 0.3*  --   --  1.0  MONOABS 1.1* 0.6  --   --  0.6  EOSABS 0.0 0.0  --   --  0.0  BASOSABS 0.0 0.0  --   --  0.0    Recent Labs  Lab 08/07/23 0852 08/07/23 1326 08/07/23 1328 08/07/23 1515 08/07/23 1715 08/07/23 2034 08/08/23 0113 08/08/23 0640 08/08/23 1323 08/09/23 0453  NA 131* 130*  --  132* 131* 131* 132* 134*  --  135  K  3.2* 3.4*  --  3.6 5.1 3.1* 3.0* 3.2*  --  2.7*  CL 92* 98  --   --  106 105 105 108  --  103  CO2 <7* <7*  --   --  8* 13* 15* 16*  --  22  ANIONGAP NOT CALCULATED NOT CALCULATED  --   --  17* 13 12 10   --  10  GLUCOSE 326* 269*  --   --  211* 219* 191* 137*  --  90  BUN 6 6  --   --  5* 5* <5* 7  --  <5*  CREATININE 1.46* 1.26*  --   --  0.90 0.84 0.68 0.57  --  0.42*  AST 417*  --   --   --   --   --   --  313*  --  326*  ALT 133*  --   --   --   --   --   --  93*  --  90*  ALKPHOS 151*  --   --   --   --   --   --  105  --  90  BILITOT 4.7*  --   --   --   --   --   --  3.2*  --  2.6*  ALBUMIN 4.6  --   --   --   --   --   --  3.5  --  2.6*  LATICACIDVEN  --   --  2.2* 2.5*  --   --   --   --   --   --   INR  --  1.1  --   --   --   --   --   --   --   --   TSH  --   --   --   --   --   --   --   --  1.339  --   HGBA1C  --  4.0*  --   --   --   --   --   --   --   --   AMMONIA  --  78*  --   --   --   --   --   --   --   --   MG 1.8  --   --   --   --  1.2*  --  1.3*  --  1.5*  PHOS  --   --   --   --   --   --   --   --   --  <  1.0*  CALCIUM 9.9 9.0  --   --  8.7* 9.4 9.4 9.9  --  8.3*      Recent Labs  Lab 08/07/23 0852 08/07/23 1326 08/07/23 1328 08/07/23 1515 08/07/23 1715 08/07/23 2034 08/08/23 0113 08/08/23 0640 08/08/23 1323 08/09/23 0453  LATICACIDVEN  --   --  2.2* 2.5*  --   --   --   --   --   --   INR  --  1.1  --   --   --   --   --   --   --   --   TSH  --   --   --   --   --   --   --   --  1.339  --   HGBA1C  --  4.0*  --   --   --   --   --   --   --   --   AMMONIA  --  78*  --   --   --   --   --   --   --   --   MG 1.8  --   --   --   --  1.2*  --  1.3*  --  1.5*  CALCIUM 9.9 9.0  --   --  8.7* 9.4 9.4 9.9  --  8.3*    --------------------------------------------------------------------------------------------------------------- Lab Results  Component Value Date   TRIG 192 (H) 08/07/2023    Lab Results  Component Value Date   HGBA1C 4.0 (L)  08/07/2023   Recent Labs    08/08/23 1323  TSH 1.339  FREET4 1.10   No results for input(s): "VITAMINB12", "FOLATE", "FERRITIN", "TIBC", "IRON", "RETICCTPCT" in the last 72 hours.    Radiology Report US  Abdomen Limited RUQ (LIVER/GB) Result Date: 08/07/2023 CLINICAL DATA:  Pain EXAM: ULTRASOUND ABDOMEN LIMITED RIGHT UPPER QUADRANT COMPARISON:  CT from the same day, and previous FINDINGS: Gallbladder: Physiologically distended. No wall thickening or gallstones. No sonographic Murphy's sign reported by sonographer. Wall thickness 2.6 mm. No pericholecystic fluid. Common bile duct: Diameter: 2.9 mm.  No intrahepatic biliary ductal dilatation. Liver: No focal lesion identified. Within normal limits in parenchymal echogenicity. Portal vein is patent on color Doppler imaging with normal direction of blood flow towards the liver. Other: None. IMPRESSION: Negative. Electronically Signed   By: Nicoletta Barrier M.D.   On: 08/07/2023 18:52   CT ABDOMEN PELVIS W CONTRAST Result Date: 08/07/2023 CLINICAL DATA:  Abdominal pain, acute, nonlocalized EXAM: CT ABDOMEN AND PELVIS WITH CONTRAST TECHNIQUE: Multidetector CT imaging of the abdomen and pelvis was performed using the standard protocol following bolus administration of intravenous contrast. RADIATION DOSE REDUCTION: This exam was performed according to the departmental dose-optimization program which includes automated exposure control, adjustment of the mA and/or kV according to patient size and/or use of iterative reconstruction technique. CONTRAST:  75mL OMNIPAQUE  IOHEXOL  350 MG/ML SOLN COMPARISON:  Ultrasound 06/26/2019 FINDINGS: Lower chest: Included lung bases are clear.  Heart size is normal. Hepatobiliary: Severe hepatic steatosis. Liver is enlarged measuring approximately 23 cm in length. No focal liver lesion identified. Unremarkable gallbladder. No hyperdense gallstone. No biliary dilatation. Pancreas: Pancreatic tail appears enlarged and edematous.  There is peripancreatic fluid adjacent to the pancreatic tail. No organized or walled-off collections. No focal area of parenchymal non enhancement to suggest necrosis. No ductal dilatation. Spleen: Normal in size without focal abnormality. Adrenals/Urinary Tract: Unremarkable adrenal glands. Kidneys enhance symmetrically without focal lesion, stone,  or hydronephrosis. Ureters are nondilated. Urinary bladder appears unremarkable for the degree of distention. Stomach/Bowel: Stomach is within normal limits. No evidence of bowel wall thickening, distention, or inflammatory changes. Vascular/Lymphatic: No significant vascular findings are present. No enlarged abdominal or pelvic lymph nodes. Reproductive: Uterus and bilateral adnexa are unremarkable. Other: No pneumoperitoneum.  No abdominal wall hernia. Musculoskeletal: No acute or significant osseous findings. IMPRESSION: 1. Findings compatible with acute uncomplicated pancreatitis. 2. Severe hepatic steatosis and hepatomegaly. Electronically Signed   By: Leverne Reading D.O.   On: 08/07/2023 15:30     Signature  -   Lynnwood Sauer M.D on 08/09/2023 at 10:25 AM   -  To page go to [email or web address]

## 2023-08-09 NOTE — Plan of Care (Signed)
  Problem: Health Behavior/Discharge Planning: Goal: Ability to manage health-related needs will improve Outcome: Progressing   Problem: Clinical Measurements: Goal: Ability to maintain clinical measurements within normal limits will improve Outcome: Progressing Goal: Will remain free from infection Outcome: Progressing   Problem: Activity: Goal: Risk for activity intolerance will decrease Outcome: Progressing   Problem: Pain Managment: Goal: General experience of comfort will improve and/or be controlled Outcome: Progressing

## 2023-08-09 NOTE — Progress Notes (Signed)
 Mobility Specialist Progress Note:    08/09/23 1212  Mobility  Activity Ambulated with assistance to bathroom  Level of Assistance Standby assist, set-up cues, supervision of patient - no hands on  Assistive Device None  Distance Ambulated (ft) 10 ft  Activity Response Tolerated well  Mobility Referral Yes  Mobility visit 1 Mobility  Mobility Specialist Start Time (ACUTE ONLY) 1103  Mobility Specialist Stop Time (ACUTE ONLY) 1113  Mobility Specialist Time Calculation (min) (ACUTE ONLY) 10 min   Pt received sitting EOB requesting assistance to the BR. No physical assistance needed just standby for safety. Pt finished in BR and returned to supine in bed. Call bell and personal belongings in reach. All needs met.  Inetta Manes Mobility Specialist  Please contact vis Secure Chat or  Rehab Office [phone]

## 2023-08-10 ENCOUNTER — Inpatient Hospital Stay (HOSPITAL_COMMUNITY)

## 2023-08-10 DIAGNOSIS — R1112 Projectile vomiting: Secondary | ICD-10-CM | POA: Diagnosis not present

## 2023-08-10 LAB — GLUCOSE, CAPILLARY
Glucose-Capillary: 113 mg/dL — ABNORMAL HIGH (ref 70–99)
Glucose-Capillary: 134 mg/dL — ABNORMAL HIGH (ref 70–99)
Glucose-Capillary: 95 mg/dL (ref 70–99)
Glucose-Capillary: 119 mg/dL — ABNORMAL HIGH (ref 70–99)

## 2023-08-10 LAB — CBC WITH DIFFERENTIAL/PLATELET
Abs Immature Granulocytes: 0.01 10*3/uL (ref 0.00–0.07)
Basophils Absolute: 0 10*3/uL (ref 0.0–0.1)
Basophils Relative: 1 %
Eosinophils Absolute: 0 10*3/uL (ref 0.0–0.5)
Eosinophils Relative: 1 %
HCT: 26.5 % — ABNORMAL LOW (ref 36.0–46.0)
Hemoglobin: 9.3 g/dL — ABNORMAL LOW (ref 12.0–15.0)
Immature Granulocytes: 0 %
Lymphocytes Relative: 34 %
Lymphs Abs: 1.2 10*3/uL (ref 0.7–4.0)
MCH: 36.8 pg — ABNORMAL HIGH (ref 26.0–34.0)
MCHC: 35.1 g/dL (ref 30.0–36.0)
MCV: 104.7 fL — ABNORMAL HIGH (ref 80.0–100.0)
Monocytes Absolute: 0.4 10*3/uL (ref 0.1–1.0)
Monocytes Relative: 11 %
Neutro Abs: 1.9 10*3/uL (ref 1.7–7.7)
Neutrophils Relative %: 53 %
Platelets: 156 10*3/uL (ref 150–400)
RBC: 2.53 MIL/uL — ABNORMAL LOW (ref 3.87–5.11)
RDW: 13.7 % (ref 11.5–15.5)
WBC: 3.5 10*3/uL — ABNORMAL LOW (ref 4.0–10.5)
nRBC: 0 % (ref 0.0–0.2)

## 2023-08-10 LAB — COMPREHENSIVE METABOLIC PANEL WITH GFR
ALT: 87 U/L — ABNORMAL HIGH (ref 0–44)
AST: 271 U/L — ABNORMAL HIGH (ref 15–41)
Albumin: 2.5 g/dL — ABNORMAL LOW (ref 3.5–5.0)
Alkaline Phosphatase: 93 U/L (ref 38–126)
Anion gap: 6 (ref 5–15)
BUN: <5 mg/dL — ABNORMAL LOW (ref 6–20)
CO2: 27 mmol/L (ref 22–32)
Calcium: 7.6 mg/dL — ABNORMAL LOW (ref 8.9–10.3)
Chloride: 101 mmol/L (ref 98–111)
Creatinine, Ser: 0.35 mg/dL — ABNORMAL LOW (ref 0.44–1.00)
GFR, Estimated: >60 mL/min (ref >60)
Glucose, Bld: 94 mg/dL (ref 70–99)
Potassium: 3.1 mmol/L — ABNORMAL LOW (ref 3.5–5.1)
Sodium: 134 mmol/L — ABNORMAL LOW (ref 135–145)
Total Bilirubin: 1.7 mg/dL — ABNORMAL HIGH (ref 0.0–1.2)
Total Protein: 4.9 g/dL — ABNORMAL LOW (ref 6.5–8.1)

## 2023-08-10 LAB — URINE CULTURE: Culture: >=100000 — AB

## 2023-08-10 LAB — PHOSPHORUS: Phosphorus: 1.1 mg/dL — ABNORMAL LOW (ref 2.5–4.6)

## 2023-08-10 LAB — MAGNESIUM: Magnesium: 1.8 mg/dL (ref 1.7–2.4)

## 2023-08-10 LAB — LIPASE, BLOOD: Lipase: 276 U/L — ABNORMAL HIGH (ref 11–51)

## 2023-08-10 MED ORDER — MORPHINE SULFATE (PF) 2 MG/ML IV SOLN: 1.0000 mg | INTRAVENOUS | Status: DC | PRN

## 2023-08-10 MED ORDER — SODIUM PHOSPHATES 45 MMOLE/15ML IV SOLN
30.0000 mmol | Freq: Two times a day (BID) | INTRAVENOUS | Status: AC
  Administered 2023-08-10 (×2): 30 mmol via INTRAVENOUS
  Filled 2023-08-10 (×2): qty 10

## 2023-08-10 MED ORDER — MAGNESIUM SULFATE 2 GM/50ML IV SOLN
2.0000 g | Freq: Once | INTRAVENOUS | Status: AC
  Administered 2023-08-10: 2 g via INTRAVENOUS
  Filled 2023-08-10: qty 50

## 2023-08-10 MED ORDER — POTASSIUM CHLORIDE CRYS ER 20 MEQ PO TBCR
40.0000 meq | EXTENDED_RELEASE_TABLET | Freq: Three times a day (TID) | ORAL | Status: DC
Start: 2023-08-10 — End: 2023-08-11
  Administered 2023-08-10 (×2): 40 meq via ORAL
  Filled 2023-08-10 (×3): qty 2

## 2023-08-10 MED ORDER — LACTATED RINGERS IV SOLN: INTRAVENOUS | Status: AC

## 2023-08-10 NOTE — Progress Notes (Signed)
   08/10/23 1554  Mobility  Activity Ambulated independently in hallway  Level of Assistance Independent  Assistive Device Other (Comment) (IV Pole)  Distance Ambulated (ft) 500 ft  Activity Response Tolerated fair  Mobility Referral Yes  Mobility visit 1 Mobility  Mobility Specialist Start Time (ACUTE ONLY) 1554  Mobility Specialist Stop Time (ACUTE ONLY) 1607  Mobility Specialist Time Calculation (min) (ACUTE ONLY) 13 min   Mobility Specialist: Progress Note  Pre-Mobility:      HR 98, SpO2 97% Post-Mobility:    HR 107, SpO2 N/A% Not a good pleth reading.   Pt agreeable to mobility session - received in bed. C/o abd pain/ discomfort with pressure/aches and "feels like it pulls" when she turns on her side in bed. Returned to standing in room with all needs met - call bell within reach. Pt encouraged to mobilize ind during her hospital stay.   Angela Palmer, BS Mobility Specialist Please contact via SecureChat or  Rehab office at [phone].Aaron Aas

## 2023-08-10 NOTE — Plan of Care (Signed)
  Problem: Clinical Measurements: Goal: Ability to maintain clinical measurements within normal limits will improve Outcome: Progressing Goal: Will remain free from infection Outcome: Progressing   Problem: Activity: Goal: Risk for activity intolerance will decrease Outcome: Progressing   Problem: Pain Managment: Goal: General experience of comfort will improve and/or be controlled Outcome: Progressing

## 2023-08-10 NOTE — Progress Notes (Signed)
 PROGRESS NOTE                                                                                                                                                                                                             Patient Demographics:    Angela Palmer, is a 38 y.o. female, DOB - 1985-07-26, OZH:[id number]  Outpatient Primary MD for the patient is Patient, No Pcp Per    LOS - 3  Admit date - 08/07/2023    Chief Complaint  Patient presents with   Nausea   Emesis       Brief Narrative (HPI from H&P)    38 y.o. female with past medical history  of alcohol abuse and tobacco abuse, allergy to minocycline, history of seizures suspect secondary to alcohol withdrawal, patient's last drink was a few days ago.pt drinks whiskey few days a week and come with nausea , vomiting and abdominal pain.  Workup was consistent with acute alcoholic pancreatitis and she was admitted to the hospital   Subjective:   Patient in bed, appears comfortable, denies any headache, no fever, no chest pain or pressure, no shortness of breath , improving constipation and epigastric abdominal pain. No new focal weakness.   Assessment  & Plan :    Acute alcoholic pancreatitis, with alcoholic ketoacidosis, dehydration, nausea vomiting and abdominal pain. She is being treated conservatively with bowel rest, IV fluids, strictly counseled to abstain from alcohol, CT abdomen pelvis and right upper quadrant ultrasound do not show any gallstones or CBD dilation, triglycerides unremarkable, treat conservatively and monitor.  AKI.  Due to dehydration.  Improving with IV fluids.  Moderate episode of recurrent major depressive disorder (HCC) -  not suicidal or homicidal, no acute issues outpatient follow-up with psych to be arranged by PCP.   Alcohol hepatitis - discriminant function score is 9, monitor   Alcoholic ketoacidosis.  Supportive care.  Also causing  high beta hydroxybutyrate, monitor.  Hypomagnesemia hypophosphatemia & hypokalemia - Replaced.   Seizure (HCC) - Alcohol withdrawal seizures, seizure precautions.  No AEDs in chart.   UTI.  Rocephin  3 days.    Constipation.  Resolved with bowel regimen.    Stress-induced hyperglycemia.  SSI if needed.  Lab Results  Component Value Date   HGBA1C 4.0 (L) 08/07/2023   CBG (last 3)  Recent Labs  08/09/23 1219 08/09/23 1737 08/09/23 2115  GLUCAP 100* 99 115*        Condition - Fair  Family Communication  :   None present  Code Status :   Full  Consults  :   PUD Prophylaxis :  PPI   Procedures  :     Right upper quadrant ultrasound.  Nonacute.    CT abdomen pelvis. 1. Findings compatible with acute uncomplicated pancreatitis. 2. Severe hepatic steatosis and hepatomegaly.       Disposition Plan  :    Status is: Inpatient  DVT Prophylaxis  :    heparin  injection 5,000 Units Start: 08/07/23 1745    Lab Results  Component Value Date   PLT 156 08/10/2023    Diet :  Diet Order             Diet heart healthy/carb modified Room service appropriate? Yes; Fluid consistency: Thin  Diet effective now                    Inpatient Medications  Scheduled Meds:  chlordiazePOXIDE   10 mg Oral TID   docusate sodium   200 mg Oral BID   folic acid   1 mg Oral Daily   heparin   5,000 Units Subcutaneous Q12H   magnesium  hydroxide  30 mL Oral BID   metoprolol  tartrate  25 mg Oral BID   multivitamin with minerals  1 tablet Oral Daily   nicotine   21 mg Transdermal Daily   polyethylene glycol  17 g Oral BID   potassium chloride   40 mEq Oral TID   sodium chloride  flush  3 mL Intravenous Q12H   sodium chloride  flush  3-10 mL Intravenous Q12H   [START ON 08/11/2023] thiamine   100 mg Oral Daily   Continuous Infusions:  lactated ringers  75 mL/hr at 08/09/23 2316   sodium PHOSPHATE IVPB (in mmol)     thiamine  (VITAMIN B1) injection 500 mg (08/09/23 0936)   PRN  Meds:.acetaminophen  **OR** acetaminophen , dextrose , HYDROcodone -acetaminophen , LORazepam  **OR** LORazepam , morphine  injection  Antibiotics  :    Anti-infectives (From admission, onward)    Start     Dose/Rate Route Frequency Ordered Stop   08/08/23 0645  cefTRIAXone  (ROCEPHIN ) 1 g in sodium chloride  0.9 % 100 mL IVPB        1 g 200 mL/hr over 30 Minutes Intravenous Every 24 hours 08/08/23 0559 08/10/23 0619         Objective:   Vitals:   08/09/23 2047 08/09/23 2317 08/10/23 0343 08/10/23 0500  BP: 118/84 102/73 (!) 104/56   Pulse: (!) 112 81 96   Resp: 18 18 13    Temp: 98 F (36.7 C) 97.9 F (36.6 C) 97.6 F (36.4 C)   TempSrc: Oral Oral Oral   SpO2: 94% 98% 97%   Weight:    49.5 kg  Height:        Wt Readings from Last 3 Encounters:  08/10/23 49.5 kg  09/28/22 56.7 kg  08/03/19 55.3 kg     Intake/Output Summary (Last 24 hours) at 08/10/2023 0853 Last data filed at 08/09/2023 2048 Gross per 24 hour  Intake 3 ml  Output --  Net 3 ml     Physical Exam  Awake Alert, No new F.N deficits, Normal affect [place].AT,PERRAL Supple Neck, No JVD,   Symmetrical Chest wall movement, Good air movement bilaterally, CTAB RRR,No Gallops,Rubs or new Murmurs,  +ve B.Sounds, Abd Soft, mild epigastric tenderness,   No Cyanosis, Clubbing or edema  Data Review:    Recent Labs  Lab 08/07/23 0852 08/07/23 1326 08/07/23 1515 08/08/23 0640 08/09/23 0453 08/10/23 0347  WBC 11.0* 9.5  --  7.9 5.0 3.5*  HGB 13.8 13.8 13.9 13.0 9.6* 9.3*  HCT 40.5 40.9 41.0 35.8* 26.8* 26.5*  PLT 197 170  --  142* 124* 156  MCV 107.4* 107.1*  --  100.0 102.3* 104.7*  MCH 36.6* 36.1*  --  36.3* 36.6* 36.8*  MCHC 34.1 33.7  --  36.3* 35.8 35.1  RDW 12.9 13.0  --  12.6 13.0 13.7  LYMPHSABS 0.3* 0.3*  --   --  1.0 1.2  MONOABS 1.1* 0.6  --   --  0.6 0.4  EOSABS 0.0 0.0  --   --  0.0 0.0  BASOSABS 0.0 0.0  --   --  0.0 0.0    Recent Labs  Lab 08/07/23 0852 08/07/23 1326  08/07/23 1328 08/07/23 1515 08/07/23 1715 08/07/23 2034 08/08/23 0113 08/08/23 0640 08/08/23 1323 08/09/23 0453 08/10/23 0347  NA 131* 130*  --  132*   < > 131* 132* 134*  --  135 134*  K 3.2* 3.4*  --  3.6   < > 3.1* 3.0* 3.2*  --  2.7* 3.1*  CL 92* 98  --   --    < > 105 105 108  --  103 101  CO2 <7* <7*  --   --    < > 13* 15* 16*  --  22 27  ANIONGAP NOT CALCULATED NOT CALCULATED  --   --    < > 13 12 10   --  10 6  GLUCOSE 326* 269*  --   --    < > 219* 191* 137*  --  90 94  BUN 6 6  --   --    < > 5* <5* 7  --  <5* <5*  CREATININE 1.46* 1.26*  --   --    < > 0.84 0.68 0.57  --  0.42* 0.35*  AST 417*  --   --   --   --   --   --  313*  --  326* 271*  ALT 133*  --   --   --   --   --   --  93*  --  90* 87*  ALKPHOS 151*  --   --   --   --   --   --  105  --  90 93  BILITOT 4.7*  --   --   --   --   --   --  3.2*  --  2.6* 1.7*  ALBUMIN 4.6  --   --   --   --   --   --  3.5  --  2.6* 2.5*  LATICACIDVEN  --   --  2.2* 2.5*  --   --   --   --   --   --   --   INR  --  1.1  --   --   --   --   --   --   --   --   --   TSH  --   --   --   --   --   --   --   --  1.339  --   --   HGBA1C  --  4.0*  --   --   --   --   --   --   --   --   --  AMMONIA  --  78*  --   --   --   --   --   --   --   --   --   MG 1.8  --   --   --   --  1.2*  --  1.3*  --  1.5* 1.8  PHOS  --   --   --   --   --   --   --   --   --  <1.0* 1.1*  CALCIUM 9.9 9.0  --   --    < > 9.4 9.4 9.9  --  8.3* 7.6*   < > = values in this interval not displayed.      Recent Labs  Lab 08/07/23 0852 08/07/23 1326 08/07/23 1328 08/07/23 1515 08/07/23 1715 08/07/23 2034 08/08/23 0113 08/08/23 0640 08/08/23 1323 08/09/23 0453 08/10/23 0347  LATICACIDVEN  --   --  2.2* 2.5*  --   --   --   --   --   --   --   INR  --  1.1  --   --   --   --   --   --   --   --   --   TSH  --   --   --   --   --   --   --   --  1.339  --   --   HGBA1C  --  4.0*  --   --   --   --   --   --   --   --   --   AMMONIA  --  78*  --    --   --   --   --   --   --   --   --   MG 1.8  --   --   --   --  1.2*  --  1.3*  --  1.5* 1.8  CALCIUM 9.9 9.0  --   --    < > 9.4 9.4 9.9  --  8.3* 7.6*   < > = values in this interval not displayed.    --------------------------------------------------------------------------------------------------------------- Lab Results  Component Value Date   TRIG 192 (H) 08/07/2023    Lab Results  Component Value Date   HGBA1C 4.0 (L) 08/07/2023   Recent Labs    08/08/23 1323  TSH 1.339  FREET4 1.10   No results for input(s): "VITAMINB12", "FOLATE", "FERRITIN", "TIBC", "IRON", "RETICCTPCT" in the last 72 hours.    Radiology Report No results found.    Signature  -   Lynnwood Sauer M.D on 08/10/2023 at 8:53 AM   -  To page go to [email or web address]

## 2023-08-10 NOTE — Plan of Care (Signed)

## 2023-08-10 NOTE — Progress Notes (Addendum)
   08/10/23 1058  Mobility  Activity Ambulated independently in hallway  Level of Assistance Standby assist, set-up cues, supervision of patient - no hands on  Assistive Device None  Distance Ambulated (ft) 500 ft  Activity Response Tolerated fair  Mobility Referral Yes  Mobility visit 1 Mobility  Mobility Specialist Start Time (ACUTE ONLY) 1058  Mobility Specialist Stop Time (ACUTE ONLY) 1117  Mobility Specialist Time Calculation (min) (ACUTE ONLY) 19 min   Mobility Specialist: Progress Note  Pre-Mobility:      HR 95, SpO2 90% During Mobility:  HR 107, SpO2 89-92% RA  Pt agreeable to mobility session - received in bed. C/o abd pain rated 4/10. Pt states she did not have SOB currently. Returned to [facility] with all needs met - call bell within reach. Pt stated she has ambulated to and from BR independently.   Angela Palmer, BS Mobility Specialist Please contact via SecureChat or  Rehab office at [phone].

## 2023-08-11 DIAGNOSIS — K852 Alcohol induced acute pancreatitis without necrosis or infection: Secondary | ICD-10-CM

## 2023-08-11 LAB — COMPREHENSIVE METABOLIC PANEL WITH GFR
ALT: 86 U/L — ABNORMAL HIGH (ref 0–44)
AST: 242 U/L — ABNORMAL HIGH (ref 15–41)
Albumin: 2.1 g/dL — ABNORMAL LOW (ref 3.5–5.0)
Alkaline Phosphatase: 92 U/L (ref 38–126)
Anion gap: 10 (ref 5–15)
BUN: <5 mg/dL — ABNORMAL LOW (ref 6–20)
CO2: 22 mmol/L (ref 22–32)
Calcium: 7.7 mg/dL — ABNORMAL LOW (ref 8.9–10.3)
Chloride: 105 mmol/L (ref 98–111)
Creatinine, Ser: <0.3 mg/dL — ABNORMAL LOW (ref 0.44–1.00)
Glucose, Bld: 76 mg/dL (ref 70–99)
Potassium: 3.7 mmol/L (ref 3.5–5.1)
Sodium: 137 mmol/L (ref 135–145)
Total Bilirubin: 0.9 mg/dL (ref 0.0–1.2)
Total Protein: 4.4 g/dL — ABNORMAL LOW (ref 6.5–8.1)

## 2023-08-11 LAB — CBC WITH DIFFERENTIAL/PLATELET
Abs Immature Granulocytes: 0.01 10*3/uL (ref 0.00–0.07)
Basophils Absolute: 0 10*3/uL (ref 0.0–0.1)
Basophils Relative: 1 %
Eosinophils Absolute: 0 10*3/uL (ref 0.0–0.5)
Eosinophils Relative: 1 %
HCT: 25.4 % — ABNORMAL LOW (ref 36.0–46.0)
Hemoglobin: 8.5 g/dL — ABNORMAL LOW (ref 12.0–15.0)
Immature Granulocytes: 0 %
Lymphocytes Relative: 33 %
Lymphs Abs: 1.2 10*3/uL (ref 0.7–4.0)
MCH: 36.6 pg — ABNORMAL HIGH (ref 26.0–34.0)
MCHC: 33.5 g/dL (ref 30.0–36.0)
MCV: 109.5 fL — ABNORMAL HIGH (ref 80.0–100.0)
Monocytes Absolute: 0.6 10*3/uL (ref 0.1–1.0)
Monocytes Relative: 17 %
Neutro Abs: 1.8 10*3/uL (ref 1.7–7.7)
Neutrophils Relative %: 48 %
Platelets: 210 10*3/uL (ref 150–400)
RBC: 2.32 MIL/uL — ABNORMAL LOW (ref 3.87–5.11)
RDW: 14.7 % (ref 11.5–15.5)
WBC: 3.6 10*3/uL — ABNORMAL LOW (ref 4.0–10.5)
nRBC: 0 % (ref 0.0–0.2)

## 2023-08-11 LAB — GLUCOSE, CAPILLARY
Glucose-Capillary: 78 mg/dL (ref 70–99)
Glucose-Capillary: 110 mg/dL — ABNORMAL HIGH (ref 70–99)
Glucose-Capillary: 116 mg/dL — ABNORMAL HIGH (ref 70–99)
Glucose-Capillary: 126 mg/dL — ABNORMAL HIGH (ref 70–99)

## 2023-08-11 LAB — MAGNESIUM: Magnesium: 1.4 mg/dL — ABNORMAL LOW (ref 1.7–2.4)

## 2023-08-11 LAB — PHOSPHORUS: Phosphorus: 3.7 mg/dL (ref 2.5–4.6)

## 2023-08-11 MED ORDER — POTASSIUM CHLORIDE CRYS ER 20 MEQ PO TBCR
40.0000 meq | EXTENDED_RELEASE_TABLET | Freq: Once | ORAL | Status: AC
  Administered 2023-08-11: 40 meq via ORAL
  Filled 2023-08-11: qty 2

## 2023-08-11 MED ORDER — MAGNESIUM SULFATE 4 GM/100ML IV SOLN
4.0000 g | Freq: Two times a day (BID) | INTRAVENOUS | Status: AC
  Administered 2023-08-11 (×2): 4 g via INTRAVENOUS
  Filled 2023-08-11 (×2): qty 100

## 2023-08-11 NOTE — Progress Notes (Signed)
   08/11/23 1138  Mobility  Activity Ambulated independently in hallway  Level of Assistance Independent  Assistive Device Other (Comment) (IV Pole)  Distance Ambulated (ft) 1000 ft  Activity Response Tolerated well  Mobility Referral Yes  Mobility visit 1 Mobility  Mobility Specialist Start Time (ACUTE ONLY) 1138  Mobility Specialist Stop Time (ACUTE ONLY) 1158  Mobility Specialist Time Calculation (min) (ACUTE ONLY) 20 min   Mobility Specialist: Progress Note  During Mobility: HR 112-113 Post-Mobility:    HR 99  Pt agreeable to mobility session - received in BR. Pt was asymptomatic throughout session with no complaints. Pt denies pain, nausea, or SOB. Returned to [facility] with all needs met - call bell within reach.   Isla Mari, BS Mobility Specialist Please contact via SecureChat or  Rehab office at [phone].

## 2023-08-11 NOTE — TOC Progression Note (Signed)
 Transition of Care [facility]) - Progression Note    Patient Details  Name: Angela Palmer MRN: [id number] Date of Birth: 1986/02/02  [facility] CM/SW Contact  Deshayla Empson Gwyneth Lesch, Student-Social Work Phone Number: 08/11/2023, 3:13 PM  Clinical Narrative:    MSW Intern completed ETOH consult with pt. Pt denied resources and stated she only drinks socially on occasion. Pt stated she does not currently have a PCP and would like to be connected with one through [facility]. Pt lives with her fiance and has access to transportation.        Expected Discharge Plan and Services                                               Social Determinants of Health (SDOH) Interventions SDOH Screenings   Tobacco Use: High Risk (08/07/2023)    Readmission Risk Interventions     No data to display

## 2023-08-11 NOTE — Plan of Care (Signed)

## 2023-08-11 NOTE — Progress Notes (Signed)
 PROGRESS NOTE                                                                                                                                                                                                             Patient Demographics:    Angela Palmer, is a 38 y.o. female, DOB - 1986/04/18, [id number]  Outpatient Primary MD for the patient is Patient, No Pcp Per    LOS - 4  Admit date - 08/07/2023    Chief Complaint  Patient presents with   Nausea   Emesis       Brief Narrative (HPI from H&P)    38 y.o. female with past medical history  of alcohol abuse and tobacco abuse, allergy to minocycline, history of seizures suspect secondary to alcohol withdrawal, patient's last drink was a few days ago.pt drinks whiskey few days a week and come with nausea , vomiting and abdominal pain.  Workup was consistent with acute alcoholic pancreatitis and she was admitted to the hospital   Subjective:   Patient in bed, appears comfortable, denies any headache, no fever, no chest pain or pressure, no shortness of breath , ++ improved abdominal pain. No new focal weakness.   Assessment  & Plan :    Acute alcoholic pancreatitis, with alcoholic ketoacidosis, dehydration, nausea vomiting and abdominal pain. She is being treated conservatively with bowel rest, IV fluids, strictly counseled to abstain from alcohol, CT abdomen pelvis and right upper quadrant ultrasound do not show any gallstones or CBD dilation, triglycerides unremarkable, treat conservatively and monitor.  AKI.  Due to dehydration.  Improving with IV fluids.  Moderate episode of recurrent major depressive disorder (HCC) -  not suicidal or homicidal, no acute issues outpatient follow-up with psych to be arranged by PCP.   Alcohol hepatitis - discriminant function score is 9, monitor   Alcoholic ketoacidosis.  Supportive care.  Also causing high beta hydroxybutyrate,  monitor.  Hypomagnesemia hypophosphatemia & hypokalemia - Replaced.   Seizure (HCC) - Alcohol withdrawal seizures, seizure precautions.  No AEDs in chart.   Ecoli UTI.  Rocephin  3 days - completed.    Constipation.  Resolved with bowel regimen.    Stress-induced hyperglycemia.  SSI if needed.  Lab Results  Component Value Date   HGBA1C 4.0 (L) 08/07/2023   CBG (last 3)  Recent Labs  08/10/23 1431 08/10/23 1720 08/10/23 2059  GLUCAP 134* 95 119*        Condition - Fair  Family Communication  :   None present  Code Status :   Full  Consults  :   PUD Prophylaxis :  PPI   Procedures  :     Right upper quadrant ultrasound.  Nonacute.    CT abdomen pelvis. 1. Findings compatible with acute uncomplicated pancreatitis. 2. Severe hepatic steatosis and hepatomegaly.       Disposition Plan  :    Status is: Inpatient  DVT Prophylaxis  :    heparin  injection 5,000 Units Start: 08/07/23 1745    Lab Results  Component Value Date   PLT 210 08/11/2023    Diet :  Diet Order             Diet heart healthy/carb modified Room service appropriate? Yes; Fluid consistency: Thin  Diet effective now                    Inpatient Medications  Scheduled Meds:  chlordiazePOXIDE   10 mg Oral TID   docusate sodium   200 mg Oral BID   folic acid   1 mg Oral Daily   heparin   5,000 Units Subcutaneous Q12H   metoprolol  tartrate  25 mg Oral BID   multivitamin with minerals  1 tablet Oral Daily   nicotine   21 mg Transdermal Daily   polyethylene glycol  17 g Oral BID   sodium chloride  flush  3 mL Intravenous Q12H   sodium chloride  flush  3-10 mL Intravenous Q12H   thiamine   100 mg Oral Daily   Continuous Infusions:  lactated ringers  75 mL/hr at 08/10/23 1503   magnesium  sulfate bolus IVPB 4 g (08/11/23 0640)   PRN Meds:.acetaminophen  **OR** acetaminophen , dextrose , HYDROcodone -acetaminophen , morphine  injection  Antibiotics  :    Anti-infectives (From admission,  onward)    Start     Dose/Rate Route Frequency Ordered Stop   08/08/23 0645  cefTRIAXone  (ROCEPHIN ) 1 g in sodium chloride  0.9 % 100 mL IVPB        1 g 200 mL/hr over 30 Minutes Intravenous Every 24 hours 08/08/23 0559 08/10/23 0619         Objective:   Vitals:   08/10/23 1951 08/10/23 2330 08/11/23 0339 08/11/23 0410  BP: 113/78 (!) 89/58 97/70   Pulse: (!) 101 96 87   Resp: 20 14 20    Temp: 98.2 F (36.8 C) 98 F (36.7 C) 98 F (36.7 C)   TempSrc: Oral Oral Oral   SpO2: 93% 97% 100%   Weight:    54.9 kg  Height:        Wt Readings from Last 3 Encounters:  08/11/23 54.9 kg  09/28/22 56.7 kg  08/03/19 55.3 kg    No intake or output data in the 24 hours ending 08/11/23 0801    Physical Exam  Awake Alert, No new F.N deficits, Normal affect [place].AT,PERRAL Supple Neck, No JVD,   Symmetrical Chest wall movement, Good air movement bilaterally, CTAB RRR,No Gallops,Rubs or new Murmurs,  +ve B.Sounds, Abd Soft, mild epigastric tenderness,   No Cyanosis, Clubbing or edema        Data Review:    Recent Labs  Lab 08/07/23 0852 08/07/23 1326 08/07/23 1515 08/08/23 0640 08/09/23 0453 08/10/23 0347 08/11/23 0404  WBC 11.0* 9.5  --  7.9 5.0 3.5* 3.6*  HGB 13.8 13.8 13.9 13.0 9.6* 9.3* 8.5*  HCT 40.5  40.9 41.0 35.8* 26.8* 26.5* 25.4*  PLT 197 170  --  142* 124* 156 210  MCV 107.4* 107.1*  --  100.0 102.3* 104.7* 109.5*  MCH 36.6* 36.1*  --  36.3* 36.6* 36.8* 36.6*  MCHC 34.1 33.7  --  36.3* 35.8 35.1 33.5  RDW 12.9 13.0  --  12.6 13.0 13.7 14.7  LYMPHSABS 0.3* 0.3*  --   --  1.0 1.2 1.2  MONOABS 1.1* 0.6  --   --  0.6 0.4 0.6  EOSABS 0.0 0.0  --   --  0.0 0.0 0.0  BASOSABS 0.0 0.0  --   --  0.0 0.0 0.0    Recent Labs  Lab 08/07/23 0852 08/07/23 1326 08/07/23 1328 08/07/23 1515 08/07/23 1715 08/07/23 2034 08/08/23 0113 08/08/23 0640 08/08/23 1323 08/09/23 0453 08/10/23 0347 08/11/23 0404  NA 131* 130*  --  132*   < > 131* 132* 134*  --  135 134*  137  K 3.2* 3.4*  --  3.6   < > 3.1* 3.0* 3.2*  --  2.7* 3.1* 3.7  CL 92* 98  --   --    < > 105 105 108  --  103 101 105  CO2 <7* <7*  --   --    < > 13* 15* 16*  --  22 27 22   ANIONGAP NOT CALCULATED NOT CALCULATED  --   --    < > 13 12 10   --  10 6 10   GLUCOSE 326* 269*  --   --    < > 219* 191* 137*  --  90 94 76  BUN 6 6  --   --    < > 5* <5* 7  --  <5* <5* <5*  CREATININE 1.46* 1.26*  --   --    < > 0.84 0.68 0.57  --  0.42* 0.35* <0.30*  AST 417*  --   --   --   --   --   --  313*  --  326* 271* 242*  ALT 133*  --   --   --   --   --   --  93*  --  90* 87* 86*  ALKPHOS 151*  --   --   --   --   --   --  105  --  90 93 92  BILITOT 4.7*  --   --   --   --   --   --  3.2*  --  2.6* 1.7* 0.9  ALBUMIN 4.6  --   --   --   --   --   --  3.5  --  2.6* 2.5* 2.1*  LATICACIDVEN  --   --  2.2* 2.5*  --   --   --   --   --   --   --   --   INR  --  1.1  --   --   --   --   --   --   --   --   --   --   TSH  --   --   --   --   --   --   --   --  1.339  --   --   --   HGBA1C  --  4.0*  --   --   --   --   --   --   --   --   --   --  AMMONIA  --  78*  --   --   --   --   --   --   --   --   --   --   MG 1.8  --   --   --   --  1.2*  --  1.3*  --  1.5* 1.8 1.4*  PHOS  --   --   --   --   --   --   --   --   --  <1.0* 1.1* 3.7  CALCIUM 9.9 9.0  --   --    < > 9.4 9.4 9.9  --  8.3* 7.6* 7.7*   < > = values in this interval not displayed.      Recent Labs  Lab 08/07/23 1326 08/07/23 1328 08/07/23 1515 08/07/23 1715 08/07/23 2034 08/08/23 0113 08/08/23 0640 08/08/23 1323 08/09/23 0453 08/10/23 0347 08/11/23 0404  LATICACIDVEN  --  2.2* 2.5*  --   --   --   --   --   --   --   --   INR 1.1  --   --   --   --   --   --   --   --   --   --   TSH  --   --   --   --   --   --   --  1.339  --   --   --   HGBA1C 4.0*  --   --   --   --   --   --   --   --   --   --   AMMONIA 78*  --   --   --   --   --   --   --   --   --   --   MG  --   --   --   --  1.2*  --  1.3*  --  1.5* 1.8 1.4*   CALCIUM 9.0  --   --    < > 9.4 9.4 9.9  --  8.3* 7.6* 7.7*   < > = values in this interval not displayed.    --------------------------------------------------------------------------------------------------------------- Lab Results  Component Value Date   TRIG 192 (H) 08/07/2023    Lab Results  Component Value Date   HGBA1C 4.0 (L) 08/07/2023   Recent Labs    08/08/23 1323  TSH 1.339  FREET4 1.10   No results for input(s): "VITAMINB12", "FOLATE", "FERRITIN", "TIBC", "IRON", "RETICCTPCT" in the last 72 hours.    Radiology Report DG Abd Portable 1V Result Date: 08/10/2023 CLINICAL DATA:  Constipation.  Patient reports pain. EXAM: PORTABLE ABDOMEN - 1 VIEW COMPARISON:  CT 3 days ago, 08/07/2023 FINDINGS: No significant formed stool in the colon. Air within nondilated left colon. No small bowel distension or evidence of obstruction. No visible radiopaque calculi. Pelvic phleboliths. IMPRESSION: Nonobstructive bowel gas pattern. No significant formed stool in the colon. Electronically Signed   By: Chadwick Colonel M.D.   On: 08/10/2023 09:53      Signature  -   Lynnwood Sauer M.D on 08/11/2023 at 8:01 AM   -  To page go to [email or web address]

## 2023-08-11 NOTE — Plan of Care (Signed)

## 2023-08-12 ENCOUNTER — Other Ambulatory Visit (HOSPITAL_COMMUNITY): Payer: Self-pay

## 2023-08-12 DIAGNOSIS — R1112 Projectile vomiting: Secondary | ICD-10-CM | POA: Diagnosis not present

## 2023-08-12 LAB — COMPREHENSIVE METABOLIC PANEL WITH GFR
ALT: 110 U/L — ABNORMAL HIGH (ref 0–44)
AST: 211 U/L — ABNORMAL HIGH (ref 15–41)
Albumin: 3.2 g/dL — ABNORMAL LOW (ref 3.5–5.0)
Alkaline Phosphatase: 151 U/L — ABNORMAL HIGH (ref 38–126)
Anion gap: 11 (ref 5–15)
BUN: <5 mg/dL — ABNORMAL LOW (ref 6–20)
CO2: 24 mmol/L (ref 22–32)
Calcium: 9.1 mg/dL (ref 8.9–10.3)
Chloride: 98 mmol/L (ref 98–111)
Creatinine, Ser: 0.4 mg/dL — ABNORMAL LOW (ref 0.44–1.00)
GFR, Estimated: >60 mL/min (ref >60)
Glucose, Bld: 99 mg/dL (ref 70–99)
Potassium: 4.2 mmol/L (ref 3.5–5.1)
Sodium: 133 mmol/L — ABNORMAL LOW (ref 135–145)
Total Bilirubin: 1.1 mg/dL (ref 0.0–1.2)
Total Protein: 6.6 g/dL (ref 6.5–8.1)

## 2023-08-12 LAB — PHOSPHORUS: Phosphorus: 4.4 mg/dL (ref 2.5–4.6)

## 2023-08-12 LAB — CBC WITH DIFFERENTIAL/PLATELET
Abs Immature Granulocytes: 0.02 10*3/uL (ref 0.00–0.07)
Basophils Absolute: 0.1 10*3/uL (ref 0.0–0.1)
Basophils Relative: 1 %
Eosinophils Absolute: 0 10*3/uL (ref 0.0–0.5)
Eosinophils Relative: 1 %
HCT: 31.6 % — ABNORMAL LOW (ref 36.0–46.0)
Hemoglobin: 10.5 g/dL — ABNORMAL LOW (ref 12.0–15.0)
Immature Granulocytes: 0 %
Lymphocytes Relative: 23 %
Lymphs Abs: 1.4 10*3/uL (ref 0.7–4.0)
MCH: 36.1 pg — ABNORMAL HIGH (ref 26.0–34.0)
MCHC: 33.2 g/dL (ref 30.0–36.0)
MCV: 108.6 fL — ABNORMAL HIGH (ref 80.0–100.0)
Monocytes Absolute: 1.3 10*3/uL — ABNORMAL HIGH (ref 0.1–1.0)
Monocytes Relative: 22 %
Neutro Abs: 3.3 10*3/uL (ref 1.7–7.7)
Neutrophils Relative %: 53 %
Platelets: 372 10*3/uL (ref 150–400)
RBC: 2.91 MIL/uL — ABNORMAL LOW (ref 3.87–5.11)
RDW: 15.2 % (ref 11.5–15.5)
WBC: 6.2 10*3/uL (ref 4.0–10.5)
nRBC: 0.3 % — ABNORMAL HIGH (ref 0.0–0.2)

## 2023-08-12 LAB — MAGNESIUM: Magnesium: 2.4 mg/dL (ref 1.7–2.4)

## 2023-08-12 LAB — GLUCOSE, CAPILLARY: Glucose-Capillary: 98 mg/dL (ref 70–99)

## 2023-08-12 MED ORDER — THIAMINE HCL 100 MG PO TABS
100.0000 mg | ORAL_TABLET | Freq: Every day | ORAL | 0 refills | Status: AC
  Filled 2023-08-12: qty 30, 30d supply, fill #0

## 2023-08-12 MED ORDER — NICOTINE 21 MG/24HR TD PT24
21.0000 mg | MEDICATED_PATCH | Freq: Every day | TRANSDERMAL | 0 refills | Status: AC
  Filled 2023-08-12: qty 28, 28d supply, fill #0

## 2023-08-12 MED ORDER — FOLIC ACID 1 MG PO TABS
1.0000 mg | ORAL_TABLET | Freq: Every day | ORAL | 0 refills | Status: AC
  Filled 2023-08-12: qty 30, 30d supply, fill #0

## 2023-08-12 NOTE — Progress Notes (Signed)
 Discharge instructions given. Patient verbalized understanding and all questions were answered. Patient wants to take morning medications once she get home.

## 2023-08-12 NOTE — Discharge Instructions (Signed)
 Follow with Primary MD Patient, No Pcp Per in 7 days   Get CBC, CMP, Magnesium  -  checked next visit with your primary MD    Activity: As tolerated with Full fall precautions use walker/cane & assistance as needed  Disposition Home   Diet: Heart Healthy low carbohydrate diet.  Special Instructions: If you have smoked or chewed Tobacco  in the last 2 yrs please stop smoking, stop any regular Alcohol  and or any Recreational drug use.  On your next visit with your primary care physician please Get Medicines reviewed and adjusted.  Please request your Prim.MD to go over all Hospital Tests and Procedure/Radiological results at the follow up, please get all Hospital records sent to your Prim MD by signing hospital release before you go home.  If you experience worsening of your admission symptoms, develop shortness of breath, life threatening emergency, suicidal or homicidal thoughts you must seek medical attention immediately by calling 911 or calling your MD immediately  if symptoms less severe.  You Must read complete instructions/literature along with all the possible adverse reactions/side effects for all the Medicines you take and that have been prescribed to you. Take any new Medicines after you have completely understood and accpet all the possible adverse reactions/side effects.   Do not drive when taking Pain medications.  Do not take more than prescribed Pain, Sleep and Anxiety Medications  Wear Seat belts while driving.

## 2023-08-12 NOTE — Discharge Summary (Signed)
 Angela Palmer WUJ:[id number] DOB: [birth year] DOA: 08/07/2023  PCP: Patient, No Pcp Per  Admit date: 08/07/2023  Discharge date: 08/12/2023  Admitted From: Home   Disposition:  Home   Recommendations for Outpatient Follow-up:   Follow up with PCP in 1-2 weeks  PCP Please obtain BMP/CBC, 2 view CXR in 1week,  (see Discharge instructions)   PCP Please follow up on the following pending results:    Home Health: None   Equipment/Devices: None  Consultations: None  Discharge Condition: Stable    CODE STATUS: Full    Diet Recommendation: Heart Healthy      Chief Complaint  Patient presents with   Nausea   Emesis     Brief history of present illness from the day of admission and additional interim summary    38 y.o. female with past medical history  of alcohol abuse and tobacco abuse, allergy to minocycline, history of seizures suspect secondary to alcohol withdrawal, patient's last drink was a few days ago.pt drinks whiskey few days a week and come with nausea , vomiting and abdominal pain.  Workup was consistent with acute alcoholic pancreatitis and she was admitted to the hospital                                                                  Hospital Course   Acute alcoholic pancreatitis, with alcoholic ketoacidosis, dehydration, nausea vomiting and abdominal pain. She is being treated conservatively with bowel rest, IV fluids, strictly counseled to abstain from alcohol, CT abdomen pelvis and right upper quadrant ultrasound do not show any gallstones or CBD dilation, triglycerides unremarkable, was treated conservatively, complete resolution of pain and symptoms, strictly counseled to abstain from alcohol use, she is back to her baseline and symptom-free will be discharged with outpatient PCP and GI  follow-up.   AKI.  Due to dehydration resolved with IV fluids.   Moderate episode of recurrent major depressive disorder (HCC) -  not suicidal or homicidal, no acute issues outpatient follow-up with psych to be arranged by PCP.   Alcohol hepatitis - discriminant function score is 9, stable, recommended to follow-up with GI outpatient and abstain from alcohol use.   Alcoholic ketoacidosis.  Supportive care.  Also causing high beta hydroxybutyrate, monitor.   Hypomagnesemia hypophosphatemia & hypokalemia - Replaced & stable.   Seizure (HCC) - Alcohol withdrawal seizures, seizure precautions.  No AEDs in chart.   Ecoli UTI.  Rocephin  3 days - completed.     Constipation.  Resolved with bowel regimen.     Stress-induced hyperglycemia.  Stable now  Lab Results  Component Value Date   HGBA1C 4.0 (L) 08/07/2023       Discharge diagnosis     Principal Problem:   Nausea & vomiting Active Problems:   Seizure (HCC)  Hypokalemia   Transaminitis   Hypomagnesemia   High anion gap metabolic acidosis   Alcohol abuse   Alcohol induced acute pancreatitis   Hyperglycemia   Moderate episode of recurrent major depressive disorder (HCC)   Abdominal pain   Nausea and vomiting   AKI (acute kidney injury) [facility])    Discharge instructions    Discharge Instructions     Discharge instructions   Complete by: As directed    Follow with Primary MD Patient, No Pcp Per in 7 days   Get CBC, CMP, Magnesium  -  checked next visit with your primary MD    Activity: As tolerated with Full fall precautions use walker/cane & assistance as needed  Disposition Home   Diet: Heart Healthy low carbohydrate diet.  Special Instructions: If you have smoked or chewed Tobacco  in the last 2 yrs please stop smoking, stop any regular Alcohol  and or any Recreational drug use.  On your next visit with your primary care physician please Get Medicines reviewed and adjusted.  Please request your Prim.MD to  go over all Hospital Tests and Procedure/Radiological results at the follow up, please get all Hospital records sent to your Prim MD by signing hospital release before you go home.  If you experience worsening of your admission symptoms, develop shortness of breath, life threatening emergency, suicidal or homicidal thoughts you must seek medical attention immediately by calling 911 or calling your MD immediately  if symptoms less severe.  You Must read complete instructions/literature along with all the possible adverse reactions/side effects for all the Medicines you take and that have been prescribed to you. Take any new Medicines after you have completely understood and accpet all the possible adverse reactions/side effects.   Do not drive when taking Pain medications.  Do not take more than prescribed Pain, Sleep and Anxiety Medications  Wear Seat belts while driving.   Increase activity slowly   Complete by: As directed        Discharge Medications   Allergies as of 08/12/2023       Reactions   Minocycline Swelling   Swelling of the face         Medication List     TAKE these medications    cetirizine  10 MG tablet Commonly known as: ZYRTEC  Take 1 tablet (10 mg total) by mouth daily. What changed:  when to take this reasons to take this   folic acid  1 MG tablet Commonly known as: FOLVITE  Take 1 tablet (1 mg total) by mouth daily.   nicotine  21 mg/24hr patch Commonly known as: NICODERM CQ  - dosed in mg/24 hours Place 1 patch (21 mg total) onto the skin daily.   thiamine  100 MG tablet Commonly known as: Vitamin B-1 Take 1 tablet (100 mg total) by mouth daily.         Follow-up Information      [facility]. Schedule an appointment as soon as possible for a visit in 1 week(s).   Contact information: [address] [employer] [address] [place]  [address] [phone]        Felecia Hopper, MD. Schedule an  appointment as soon as possible for a visit in 1 week(s).   Specialty: Gastroenterology Why: pancreatitis Contact information: [address] [place] [address] [phone]                 Major procedures and Radiology Reports - PLEASE review detailed and final reports thoroughly  -  DG Abd Portable 1V Result Date: 08/10/2023 CLINICAL DATA:  Constipation.  Patient reports pain. EXAM: PORTABLE ABDOMEN - 1 VIEW COMPARISON:  CT 3 days ago, 08/07/2023 FINDINGS: No significant formed stool in the colon. Air within nondilated left colon. No small bowel distension or evidence of obstruction. No visible radiopaque calculi. Pelvic phleboliths. IMPRESSION: Nonobstructive bowel gas pattern. No significant formed stool in the colon. Electronically Signed   By: Chadwick Colonel M.D.   On: 08/10/2023 09:53   US  Abdomen Limited RUQ (LIVER/GB) Result Date: 08/07/2023 CLINICAL DATA:  Pain EXAM: ULTRASOUND ABDOMEN LIMITED RIGHT UPPER QUADRANT COMPARISON:  CT from the same day, and previous FINDINGS: Gallbladder: Physiologically distended. No wall thickening or gallstones. No sonographic Murphy's sign reported by sonographer. Wall thickness 2.6 mm. No pericholecystic fluid. Common bile duct: Diameter: 2.9 mm.  No intrahepatic biliary ductal dilatation. Liver: No focal lesion identified. Within normal limits in parenchymal echogenicity. Portal vein is patent on color Doppler imaging with normal direction of blood flow towards the liver. Other: None. IMPRESSION: Negative. Electronically Signed   By: Nicoletta Barrier M.D.   On: 08/07/2023 18:52   CT ABDOMEN PELVIS W CONTRAST Result Date: 08/07/2023 CLINICAL DATA:  Abdominal pain, acute, nonlocalized EXAM: CT ABDOMEN AND PELVIS WITH CONTRAST TECHNIQUE: Multidetector CT imaging of the abdomen and pelvis was performed using the standard protocol following bolus administration of intravenous contrast. RADIATION DOSE REDUCTION: This exam was  performed according to the departmental dose-optimization program which includes automated exposure control, adjustment of the mA and/or kV according to patient size and/or use of iterative reconstruction technique. CONTRAST:  75mL OMNIPAQUE  IOHEXOL  350 MG/ML SOLN COMPARISON:  Ultrasound 06/26/2019 FINDINGS: Lower chest: Included lung bases are clear.  Heart size is normal. Hepatobiliary: Severe hepatic steatosis. Liver is enlarged measuring approximately 23 cm in length. No focal liver lesion identified. Unremarkable gallbladder. No hyperdense gallstone. No biliary dilatation. Pancreas: Pancreatic tail appears enlarged and edematous. There is peripancreatic fluid adjacent to the pancreatic tail. No organized or walled-off collections. No focal area of parenchymal non enhancement to suggest necrosis. No ductal dilatation. Spleen: Normal in size without focal abnormality. Adrenals/Urinary Tract: Unremarkable adrenal glands. Kidneys enhance symmetrically without focal lesion, stone, or hydronephrosis. Ureters are nondilated. Urinary bladder appears unremarkable for the degree of distention. Stomach/Bowel: Stomach is within normal limits. No evidence of bowel wall thickening, distention, or inflammatory changes. Vascular/Lymphatic: No significant vascular findings are present. No enlarged abdominal or pelvic lymph nodes. Reproductive: Uterus and bilateral adnexa are unremarkable. Other: No pneumoperitoneum.  No abdominal wall hernia. Musculoskeletal: No acute or significant osseous findings. IMPRESSION: 1. Findings compatible with acute uncomplicated pancreatitis. 2. Severe hepatic steatosis and hepatomegaly. Electronically Signed   By: Leverne Reading D.O.   On: 08/07/2023 15:30    Micro Results     Recent Results (from the past 240 hours)  Urine Culture     Status: Abnormal   Collection Time: 08/07/23 12:55 PM   Specimen: Urine, Random  Result Value Ref Range Status   Specimen Description URINE, RANDOM   Final   Special Requests NONE Reflexed from [id number]  Final   Culture   Final    Two isolates with different morphologies were identified as the same organism.The most resistant organism was reported. >=100,000 COLONIES/mL ESCHERICHIA COLI WITHIN MIXED ORGANISMS Performed at [facility], [address], [place], [place] [address]    Report Status 08/10/2023 FINAL  Final   Organism ID, Bacteria ESCHERICHIA COLI (A)  Final  Susceptibility   Escherichia coli - MIC*    AMPICILLIN >=32 RESISTANT Resistant     CEFAZOLIN <=4 SENSITIVE Sensitive     CEFEPIME <=0.12 SENSITIVE Sensitive     CEFTRIAXONE  <=0.25 SENSITIVE Sensitive     CIPROFLOXACIN <=0.25 SENSITIVE Sensitive     GENTAMICIN <=1 SENSITIVE Sensitive     IMIPENEM <=0.25 SENSITIVE Sensitive     NITROFURANTOIN <=16 SENSITIVE Sensitive     TRIMETH/SULFA >=320 RESISTANT Resistant     AMPICILLIN/SULBACTAM 16 INTERMEDIATE Intermediate     PIP/TAZO <=4 SENSITIVE Sensitive ug/mL    * >=100,000 COLONIES/mL ESCHERICHIA COLI    Today   Subjective    Angela Palmer today has no headache,no chest abdominal pain,no new weakness tingling or numbness, feels much better wants to go home today.     Objective   Blood pressure 107/82, pulse 89, temperature 97.9 F (36.6 C), temperature source Oral, resp. rate 18, height 5\' 5"  (1.651 m), weight 54.9 kg, SpO2 94%.   Intake/Output Summary (Last 24 hours) at 08/12/2023 0742 Last data filed at 08/12/2023 0000 Gross per 24 hour  Intake 823 ml  Output --  Net 823 ml    Exam  Awake Alert, No new F.N deficits,    [place].AT,PERRAL Supple Neck,   Symmetrical Chest wall movement, Good air movement bilaterally, CTAB RRR,No Gallops,   +ve B.Sounds, Abd Soft, Non tender,  No Cyanosis, Clubbing or edema    Data Review   Recent Labs  Lab 08/07/23 1326 08/07/23 1515 08/08/23 0640 08/09/23 0453 08/10/23 0347 08/11/23 0404 08/12/23 0359  WBC 9.5  --  7.9 5.0 3.5* 3.6* 6.2  HGB 13.8   <  > 13.0 9.6* 9.3* 8.5* 10.5*  HCT 40.9   < > 35.8* 26.8* 26.5* 25.4* 31.6*  PLT 170  --  142* 124* 156 210 372  MCV 107.1*  --  100.0 102.3* 104.7* 109.5* 108.6*  MCH 36.1*  --  36.3* 36.6* 36.8* 36.6* 36.1*  MCHC 33.7  --  36.3* 35.8 35.1 33.5 33.2  RDW 13.0  --  12.6 13.0 13.7 14.7 15.2  LYMPHSABS 0.3*  --   --  1.0 1.2 1.2 1.4  MONOABS 0.6  --   --  0.6 0.4 0.6 1.3*  EOSABS 0.0  --   --  0.0 0.0 0.0 0.0  BASOSABS 0.0  --   --  0.0 0.0 0.0 0.1   < > = values in this interval not displayed.    Recent Labs  Lab 08/07/23 1326 08/07/23 1328 08/07/23 1515 08/07/23 1715 08/08/23 0640 08/08/23 1323 08/09/23 0453 08/10/23 0347 08/11/23 0404 08/12/23 0359  NA 130*  --  132*   < > 134*  --  135 134* 137 133*  K 3.4*  --  3.6   < > 3.2*  --  2.7* 3.1* 3.7 4.2  CL 98  --   --    < > 108  --  103 101 105 98  CO2 <7*  --   --    < > 16*  --  22 27 22 24   ANIONGAP NOT CALCULATED  --   --    < > 10  --  10 6 10 11   GLUCOSE 269*  --   --    < > 137*  --  90 94 76 99  BUN 6  --   --    < > 7  --  <5* <5* <5* <5*  CREATININE 1.26*  --   --    < >  0.57  --  0.42* 0.35* <0.30* 0.40*  AST  --   --   --   --  313*  --  326* 271* 242* 211*  ALT  --   --   --   --  93*  --  90* 87* 86* 110*  ALKPHOS  --   --   --   --  105  --  90 93 92 151*  BILITOT  --   --   --   --  3.2*  --  2.6* 1.7* 0.9 1.1  ALBUMIN  --   --   --   --  3.5  --  2.6* 2.5* 2.1* 3.2*  LATICACIDVEN  --  2.2* 2.5*  --   --   --   --   --   --   --   INR 1.1  --   --   --   --   --   --   --   --   --   TSH  --   --   --   --   --  1.339  --   --   --   --   HGBA1C 4.0*  --   --   --   --   --   --   --   --   --   AMMONIA 78*  --   --   --   --   --   --   --   --   --   MG  --   --   --    < > 1.3*  --  1.5* 1.8 1.4* 2.4  PHOS  --   --   --   --   --   --  <1.0* 1.1* 3.7 4.4  CALCIUM 9.0  --   --    < > 9.9  --  8.3* 7.6* 7.7* 9.1   < > = values in this interval not displayed.    Total Time in preparing paper work, data  evaluation and todays exam - 35 minutes  Signature  -    Lynnwood Sauer M.D on 08/12/2023 at 7:42 AM   -  To page go to [email or web address]

## 2023-12-15 ENCOUNTER — Telehealth: Payer: Self-pay

## 2023-12-15 NOTE — Telephone Encounter (Signed)
 Patient called and stated she received notification in the mail from [facility] stating her pap smear was abnormal and needs to contact us  to schedule a colposcopy. Patient stated she had pap smear in 07/2023 at [facility] [address] [place], [place], [place]. I informed patient we need referral and will contact [facility]. Per Chiquita at [facility], will forward message to clinic to send referral, fax number provided. Vina, scheduler informed.

## 2023-12-31 ENCOUNTER — Telehealth: Payer: Self-pay

## 2023-12-31 NOTE — Telephone Encounter (Addendum)
 Telephoned patient and left a voice message with [facility] (referral) scheduling contact information.  Telephoned patient and left a voice message with [facility] scheduling contact information 01/03/2024

## 2024-02-16 ENCOUNTER — Ambulatory Visit: Admitting: Obstetrics and Gynecology
